# Patient Record
Sex: Female | Born: 1935 | Race: White | Hispanic: No
Health system: Southern US, Community
[De-identification: ages and names within clinical notes are randomized; demographics above are authoritative.]

## PROBLEM LIST (undated history)

## (undated) DIAGNOSIS — R42 Dizziness and giddiness: Secondary | ICD-10-CM

## (undated) DIAGNOSIS — N289 Disorder of kidney and ureter, unspecified: Secondary | ICD-10-CM

## (undated) DIAGNOSIS — K5790 Diverticulosis of intestine, part unspecified, without perforation or abscess without bleeding: Secondary | ICD-10-CM

## (undated) DIAGNOSIS — I10 Essential (primary) hypertension: Secondary | ICD-10-CM

## (undated) DIAGNOSIS — I509 Heart failure, unspecified: Secondary | ICD-10-CM

## (undated) DIAGNOSIS — D649 Anemia, unspecified: Secondary | ICD-10-CM

## (undated) DIAGNOSIS — I351 Nonrheumatic aortic (valve) insufficiency: Secondary | ICD-10-CM

## (undated) DIAGNOSIS — E785 Hyperlipidemia, unspecified: Secondary | ICD-10-CM

---

## 1998-02-17 ENCOUNTER — Other Ambulatory Visit: Admission: RE | Admit: 1998-02-17 | Discharge: 1998-02-17 | Payer: Self-pay | Admitting: Family Medicine

## 1999-05-14 ENCOUNTER — Other Ambulatory Visit: Admission: RE | Admit: 1999-05-14 | Discharge: 1999-05-14 | Payer: Self-pay | Admitting: Family Medicine

## 2000-07-13 ENCOUNTER — Ambulatory Visit (HOSPITAL_COMMUNITY): Admission: RE | Admit: 2000-07-13 | Discharge: 2000-07-13 | Payer: Self-pay | Admitting: *Deleted

## 2003-11-25 ENCOUNTER — Encounter: Admission: RE | Admit: 2003-11-25 | Discharge: 2003-11-25 | Payer: Self-pay | Admitting: Family Medicine

## 2004-05-01 ENCOUNTER — Inpatient Hospital Stay (HOSPITAL_COMMUNITY): Admission: EM | Admit: 2004-05-01 | Discharge: 2004-05-05 | Payer: Self-pay | Admitting: Emergency Medicine

## 2004-11-19 ENCOUNTER — Other Ambulatory Visit: Admission: RE | Admit: 2004-11-19 | Discharge: 2004-11-19 | Payer: Self-pay | Admitting: Family Medicine

## 2004-11-26 ENCOUNTER — Encounter (INDEPENDENT_AMBULATORY_CARE_PROVIDER_SITE_OTHER): Payer: Self-pay | Admitting: Specialist

## 2004-11-26 ENCOUNTER — Ambulatory Visit (HOSPITAL_COMMUNITY): Admission: RE | Admit: 2004-11-26 | Discharge: 2004-11-26 | Payer: Self-pay | Admitting: *Deleted

## 2008-10-07 ENCOUNTER — Encounter: Admission: RE | Admit: 2008-10-07 | Discharge: 2008-10-07 | Payer: Self-pay | Admitting: Family Medicine

## 2009-05-15 ENCOUNTER — Inpatient Hospital Stay (HOSPITAL_COMMUNITY): Admission: EM | Admit: 2009-05-15 | Discharge: 2009-05-20 | Payer: Self-pay | Admitting: Emergency Medicine

## 2009-05-16 ENCOUNTER — Ambulatory Visit: Payer: Self-pay | Admitting: Physical Medicine & Rehabilitation

## 2009-11-25 ENCOUNTER — Emergency Department (HOSPITAL_COMMUNITY): Admission: EM | Admit: 2009-11-25 | Discharge: 2009-11-25 | Payer: Self-pay | Admitting: Emergency Medicine

## 2010-05-16 ENCOUNTER — Emergency Department (HOSPITAL_COMMUNITY): Admission: EM | Admit: 2010-05-16 | Discharge: 2010-05-16 | Payer: Self-pay | Admitting: Emergency Medicine

## 2010-05-27 ENCOUNTER — Ambulatory Visit: Payer: Self-pay | Admitting: Internal Medicine

## 2010-05-28 ENCOUNTER — Encounter (INDEPENDENT_AMBULATORY_CARE_PROVIDER_SITE_OTHER): Payer: Self-pay | Admitting: Internal Medicine

## 2010-05-28 ENCOUNTER — Inpatient Hospital Stay (HOSPITAL_COMMUNITY): Admission: EM | Admit: 2010-05-28 | Discharge: 2010-05-28 | Payer: Self-pay | Admitting: Emergency Medicine

## 2010-07-26 ENCOUNTER — Inpatient Hospital Stay (HOSPITAL_COMMUNITY): Admission: EM | Admit: 2010-07-26 | Discharge: 2010-07-27 | Payer: Self-pay | Admitting: Emergency Medicine

## 2010-07-26 ENCOUNTER — Ambulatory Visit: Payer: Self-pay | Admitting: Cardiology

## 2010-12-09 LAB — CARDIAC PANEL(CRET KIN+CKTOT+MB+TROPI)
CK, MB: 0.5 ng/mL (ref 0.3–4.0)
CK, MB: 0.6 ng/mL (ref 0.3–4.0)
CK, MB: 0.8 ng/mL (ref 0.3–4.0)
Relative Index: INVALID (ref 0.0–2.5)
Total CK: 37 U/L (ref 7–177)
Total CK: 42 U/L (ref 7–177)
Troponin I: <0.01 ng/mL (ref 0.00–0.06)
Troponin I: <0.01 ng/mL (ref 0.00–0.06)

## 2010-12-09 LAB — CBC
HCT: 34.3 % — ABNORMAL LOW (ref 36.0–46.0)
Hemoglobin: 11.3 g/dL — ABNORMAL LOW (ref 12.0–15.0)
RDW: 12.7 % (ref 11.5–15.5)
WBC: 9.7 10*3/uL (ref 4.0–10.5)

## 2010-12-09 LAB — DIFFERENTIAL
Basophils Absolute: 0 10*3/uL (ref 0.0–0.1)
Basophils Relative: 0 % (ref 0–1)
Lymphocytes Relative: 18 % (ref 12–46)
Monocytes Absolute: 0.7 10*3/uL (ref 0.1–1.0)
Monocytes Relative: 8 % (ref 3–12)
Neutro Abs: 7 10*3/uL (ref 1.7–7.7)
Neutrophils Relative %: 72 % (ref 43–77)

## 2010-12-09 LAB — URINALYSIS, ROUTINE W REFLEX MICROSCOPIC
Glucose, UA: NEGATIVE mg/dL
Nitrite: NEGATIVE
Specific Gravity, Urine: 1.012 (ref 1.005–1.030)
pH: 5 (ref 5.0–8.0)

## 2010-12-09 LAB — HEPATIC FUNCTION PANEL
Alkaline Phosphatase: 68 U/L (ref 39–117)
Indirect Bilirubin: 1.2 mg/dL — ABNORMAL HIGH (ref 0.3–0.9)
Total Bilirubin: 1.3 mg/dL — ABNORMAL HIGH (ref 0.3–1.2)
Total Protein: 6.4 g/dL (ref 6.0–8.3)

## 2010-12-09 LAB — POCT I-STAT, CHEM 8
BUN: 46 mg/dL — ABNORMAL HIGH (ref 6–23)
Calcium, Ion: 1.11 mmol/L — ABNORMAL LOW (ref 1.12–1.32)
Chloride: 112 mEq/L (ref 96–112)
HCT: 36 % (ref 36.0–46.0)
Potassium: 4.1 mEq/L (ref 3.5–5.1)

## 2010-12-09 LAB — BASIC METABOLIC PANEL
BUN: 38 mg/dL — ABNORMAL HIGH (ref 6–23)
CO2: 29 mEq/L (ref 19–32)
Chloride: 109 mEq/L (ref 96–112)
Glucose, Bld: 77 mg/dL (ref 70–99)
Potassium: 4.3 mEq/L (ref 3.5–5.1)
Sodium: 143 mEq/L (ref 135–145)

## 2010-12-09 LAB — LIPASE, BLOOD: Lipase: 37 U/L (ref 11–59)

## 2010-12-09 LAB — POCT CARDIAC MARKERS
CKMB, poc: <1 ng/mL — ABNORMAL LOW (ref 1.0–8.0)
Troponin i, poc: <0.05 ng/mL (ref 0.00–0.09)

## 2010-12-10 LAB — COMPREHENSIVE METABOLIC PANEL
ALT: 16 U/L (ref 0–35)
Alkaline Phosphatase: 75 U/L (ref 39–117)
CO2: 30 mEq/L (ref 19–32)
Chloride: 111 mEq/L (ref 96–112)
GFR calc non Af Amer: 23 mL/min — ABNORMAL LOW (ref >60)
Glucose, Bld: 104 mg/dL — ABNORMAL HIGH (ref 70–99)
Potassium: 4.6 mEq/L (ref 3.5–5.1)
Sodium: 148 mEq/L — ABNORMAL HIGH (ref 135–145)
Total Bilirubin: 0.8 mg/dL (ref 0.3–1.2)

## 2010-12-10 LAB — CARDIAC PANEL(CRET KIN+CKTOT+MB+TROPI)
CK, MB: 0.8 ng/mL (ref 0.3–4.0)
Relative Index: INVALID (ref 0.0–2.5)
Total CK: 48 U/L (ref 7–177)
Troponin I: 0.02 ng/mL (ref 0.00–0.06)

## 2010-12-10 LAB — MAGNESIUM: Magnesium: 2.5 mg/dL (ref 1.5–2.5)

## 2010-12-10 LAB — LIPID PANEL: VLDL: 22 mg/dL (ref 0–40)

## 2010-12-11 LAB — URINE CULTURE
Colony Count: >=100000
Culture  Setup Time: 201108202110

## 2010-12-11 LAB — CBC
HCT: 32.8 % — ABNORMAL LOW (ref 36.0–46.0)
HCT: 32.9 % — ABNORMAL LOW (ref 36.0–46.0)
Hemoglobin: 10.8 g/dL — ABNORMAL LOW (ref 12.0–15.0)
Hemoglobin: 10.9 g/dL — ABNORMAL LOW (ref 12.0–15.0)
MCH: 30.2 pg (ref 26.0–34.0)
MCH: 30.9 pg (ref 26.0–34.0)
MCHC: 32.8 g/dL (ref 30.0–36.0)
MCHC: 33.2 g/dL (ref 30.0–36.0)
MCV: 91.9 fL (ref 78.0–100.0)
MCV: 92.9 fL (ref 78.0–100.0)
Platelets: 172 K/uL (ref 150–400)
Platelets: 189 K/uL (ref 150–400)
RBC: 3.53 MIL/uL — ABNORMAL LOW (ref 3.87–5.11)
RBC: 3.58 MIL/uL — ABNORMAL LOW (ref 3.87–5.11)
RDW: 12.5 % (ref 11.5–15.5)
RDW: 12.8 % (ref 11.5–15.5)
WBC: 6.5 K/uL (ref 4.0–10.5)
WBC: 7.7 K/uL (ref 4.0–10.5)

## 2010-12-11 LAB — POCT I-STAT, CHEM 8
BUN: 53 mg/dL — ABNORMAL HIGH (ref 6–23)
Calcium, Ion: 1.19 mmol/L (ref 1.12–1.32)
Chloride: 109 meq/L (ref 96–112)
Creatinine, Ser: 2 mg/dL — ABNORMAL HIGH (ref 0.4–1.2)
Glucose, Bld: 94 mg/dL (ref 70–99)
HCT: 33 % — ABNORMAL LOW (ref 36.0–46.0)
Hemoglobin: 11.2 g/dL — ABNORMAL LOW (ref 12.0–15.0)
Potassium: 4.8 meq/L (ref 3.5–5.1)
Sodium: 141 meq/L (ref 135–145)
TCO2: 28 mmol/L (ref 0–100)

## 2010-12-11 LAB — POCT CARDIAC MARKERS: Troponin i, poc: <0.05 ng/mL (ref 0.00–0.09)

## 2010-12-11 LAB — BASIC METABOLIC PANEL
BUN: 44 mg/dL — ABNORMAL HIGH (ref 6–23)
CO2: 28 mEq/L (ref 19–32)
Calcium: 8.9 mg/dL (ref 8.4–10.5)
Chloride: 112 mEq/L (ref 96–112)
Creatinine, Ser: 2.22 mg/dL — ABNORMAL HIGH (ref 0.4–1.2)
Glucose, Bld: 131 mg/dL — ABNORMAL HIGH (ref 70–99)

## 2010-12-11 LAB — URINALYSIS, ROUTINE W REFLEX MICROSCOPIC
Bilirubin Urine: NEGATIVE
Glucose, UA: NEGATIVE mg/dL
Hgb urine dipstick: NEGATIVE
Ketones, ur: NEGATIVE mg/dL
Nitrite: POSITIVE — AB
Protein, ur: NEGATIVE mg/dL
Specific Gravity, Urine: 1.011 (ref 1.005–1.030)
Urobilinogen, UA: 0.2 mg/dL (ref 0.0–1.0)
pH: 5 (ref 5.0–8.0)

## 2010-12-11 LAB — DIFFERENTIAL
Basophils Absolute: 0 K/uL (ref 0.0–0.1)
Basophils Relative: 0 % (ref 0–1)
Basophils Relative: 1 % (ref 0–1)
Eosinophils Absolute: 0.2 K/uL (ref 0.0–0.7)
Eosinophils Absolute: 0.3 10*3/uL (ref 0.0–0.7)
Eosinophils Relative: 4 % (ref 0–5)
Eosinophils Relative: 4 % (ref 0–5)
Lymphocytes Relative: 28 % (ref 12–46)
Lymphs Abs: 1.8 K/uL (ref 0.7–4.0)
Lymphs Abs: 1.9 10*3/uL (ref 0.7–4.0)
Monocytes Absolute: 0.7 K/uL (ref 0.1–1.0)
Monocytes Relative: 11 % (ref 3–12)
Neutro Abs: 3.7 K/uL (ref 1.7–7.7)
Neutrophils Relative %: 57 % (ref 43–77)
Neutrophils Relative %: 61 % (ref 43–77)

## 2010-12-11 LAB — URINE MICROSCOPIC-ADD ON

## 2010-12-11 LAB — BRAIN NATRIURETIC PEPTIDE: Pro B Natriuretic peptide (BNP): 307 pg/mL — ABNORMAL HIGH (ref 0.0–100.0)

## 2010-12-21 LAB — COMPREHENSIVE METABOLIC PANEL
ALT: 21 U/L (ref 0–35)
Alkaline Phosphatase: 85 U/L (ref 39–117)
BUN: 48 mg/dL — ABNORMAL HIGH (ref 6–23)
Chloride: 102 mEq/L (ref 96–112)
Glucose, Bld: 100 mg/dL — ABNORMAL HIGH (ref 70–99)
Potassium: 4.3 mEq/L (ref 3.5–5.1)
Sodium: 138 mEq/L (ref 135–145)
Total Bilirubin: 1 mg/dL (ref 0.3–1.2)
Total Protein: 6.9 g/dL (ref 6.0–8.3)

## 2010-12-21 LAB — URINE MICROSCOPIC-ADD ON

## 2010-12-21 LAB — URINALYSIS, ROUTINE W REFLEX MICROSCOPIC
Glucose, UA: NEGATIVE mg/dL
Ketones, ur: NEGATIVE mg/dL
Nitrite: NEGATIVE
Specific Gravity, Urine: 1.016 (ref 1.005–1.030)
pH: 6.5 (ref 5.0–8.0)

## 2010-12-21 LAB — POCT CARDIAC MARKERS
CKMB, poc: 2.1 ng/mL (ref 1.0–8.0)
CKMB, poc: 2.9 ng/mL (ref 1.0–8.0)
Myoglobin, poc: >500 ng/mL (ref 12–200)
Myoglobin, poc: >500 ng/mL (ref 12–200)

## 2010-12-21 LAB — DIFFERENTIAL
Basophils Absolute: 0 10*3/uL (ref 0.0–0.1)
Basophils Relative: 0 % (ref 0–1)
Eosinophils Absolute: 0.2 10*3/uL (ref 0.0–0.7)
Monocytes Absolute: 0.5 10*3/uL (ref 0.1–1.0)
Monocytes Relative: 6 % (ref 3–12)
Neutro Abs: 6.7 10*3/uL (ref 1.7–7.7)
Neutrophils Relative %: 76 % (ref 43–77)

## 2010-12-21 LAB — CBC
HCT: 33.6 % — ABNORMAL LOW (ref 36.0–46.0)
Hemoglobin: 11.4 g/dL — ABNORMAL LOW (ref 12.0–15.0)
MCV: 93.3 fL (ref 78.0–100.0)
RBC: 3.6 MIL/uL — ABNORMAL LOW (ref 3.87–5.11)
WBC: 8.9 10*3/uL (ref 4.0–10.5)

## 2010-12-21 LAB — URINE CULTURE

## 2011-01-02 LAB — URINE CULTURE

## 2011-01-02 LAB — BASIC METABOLIC PANEL
BUN: 33 mg/dL — ABNORMAL HIGH (ref 6–23)
CO2: 26 mEq/L (ref 19–32)
Calcium: 8.9 mg/dL (ref 8.4–10.5)
Chloride: 107 mEq/L (ref 96–112)
GFR calc Af Amer: 41 mL/min — ABNORMAL LOW (ref >60)
GFR calc non Af Amer: 34 mL/min — ABNORMAL LOW (ref >60)
Potassium: 4.4 mEq/L (ref 3.5–5.1)
Potassium: 4.8 mEq/L (ref 3.5–5.1)
Sodium: 140 mEq/L (ref 135–145)

## 2011-01-02 LAB — PROTIME-INR
INR: 0.9 (ref 0.00–1.49)
INR: 1.1 (ref 0.00–1.49)
INR: 1.4 (ref 0.00–1.49)
INR: 1.8 — ABNORMAL HIGH (ref 0.00–1.49)
Prothrombin Time: 12.4 s (ref 11.6–15.2)
Prothrombin Time: 16.8 seconds — ABNORMAL HIGH (ref 11.6–15.2)
Prothrombin Time: 25.5 seconds — ABNORMAL HIGH (ref 11.6–15.2)

## 2011-01-02 LAB — CBC
HCT: 22.6 % — ABNORMAL LOW (ref 36.0–46.0)
HCT: 32.2 % — ABNORMAL LOW (ref 36.0–46.0)
Hemoglobin: 11.1 g/dL — ABNORMAL LOW (ref 12.0–15.0)
MCHC: 35.1 g/dL (ref 30.0–36.0)
MCV: 95.5 fL (ref 78.0–100.0)
Platelets: 163 10*3/uL (ref 150–400)
Platelets: 194 10*3/uL (ref 150–400)
RBC: 2.37 MIL/uL — ABNORMAL LOW (ref 3.87–5.11)
RBC: 3.36 MIL/uL — ABNORMAL LOW (ref 3.87–5.11)
RDW: 13.1 % (ref 11.5–15.5)
WBC: 9.8 10*3/uL (ref 4.0–10.5)

## 2011-01-02 LAB — URINALYSIS, ROUTINE W REFLEX MICROSCOPIC
Glucose, UA: NEGATIVE mg/dL
Hgb urine dipstick: NEGATIVE
Protein, ur: NEGATIVE mg/dL
Specific Gravity, Urine: 1.018 (ref 1.005–1.030)
pH: 6 (ref 5.0–8.0)

## 2011-01-02 LAB — DIFFERENTIAL
Basophils Relative: 1 % (ref 0–1)
Eosinophils Relative: 0 % (ref 0–5)
Lymphocytes Relative: 8 % — ABNORMAL LOW (ref 12–46)
Lymphs Abs: 0.9 10*3/uL (ref 0.7–4.0)
Monocytes Relative: 7 % (ref 3–12)
Neutro Abs: 9.2 10*3/uL — ABNORMAL HIGH (ref 1.7–7.7)

## 2011-01-02 LAB — HEMOGLOBIN AND HEMATOCRIT, BLOOD: Hemoglobin: 8.4 g/dL — ABNORMAL LOW (ref 12.0–15.0)

## 2011-01-02 LAB — URINE MICROSCOPIC-ADD ON

## 2011-02-05 ENCOUNTER — Emergency Department (HOSPITAL_COMMUNITY)
Admission: EM | Admit: 2011-02-05 | Discharge: 2011-02-05 | Disposition: A | Discharge status: Home / Self Care | Payer: Medicare Other | Attending: Emergency Medicine | Admitting: Emergency Medicine

## 2011-02-05 ENCOUNTER — Emergency Department (HOSPITAL_COMMUNITY): Payer: Medicare Other

## 2011-02-05 ENCOUNTER — Encounter (HOSPITAL_COMMUNITY): Payer: Self-pay | Admitting: Radiology

## 2011-02-05 DIAGNOSIS — M545 Low back pain, unspecified: Secondary | ICD-10-CM | POA: Insufficient documentation

## 2011-02-05 DIAGNOSIS — Y92009 Unspecified place in unspecified non-institutional (private) residence as the place of occurrence of the external cause: Secondary | ICD-10-CM | POA: Insufficient documentation

## 2011-02-05 DIAGNOSIS — IMO0002 Reserved for concepts with insufficient information to code with codable children: Secondary | ICD-10-CM | POA: Insufficient documentation

## 2011-02-05 DIAGNOSIS — Z79899 Other long term (current) drug therapy: Secondary | ICD-10-CM | POA: Insufficient documentation

## 2011-02-05 DIAGNOSIS — S0003XA Contusion of scalp, initial encounter: Secondary | ICD-10-CM | POA: Insufficient documentation

## 2011-02-05 DIAGNOSIS — M25559 Pain in unspecified hip: Secondary | ICD-10-CM | POA: Insufficient documentation

## 2011-02-05 DIAGNOSIS — S0990XA Unspecified injury of head, initial encounter: Secondary | ICD-10-CM | POA: Insufficient documentation

## 2011-02-05 DIAGNOSIS — M542 Cervicalgia: Secondary | ICD-10-CM | POA: Insufficient documentation

## 2011-02-05 DIAGNOSIS — W010XXA Fall on same level from slipping, tripping and stumbling without subsequent striking against object, initial encounter: Secondary | ICD-10-CM | POA: Insufficient documentation

## 2011-02-05 DIAGNOSIS — I10 Essential (primary) hypertension: Secondary | ICD-10-CM | POA: Insufficient documentation

## 2011-02-05 DIAGNOSIS — I509 Heart failure, unspecified: Secondary | ICD-10-CM | POA: Insufficient documentation

## 2011-02-05 DIAGNOSIS — R51 Headache: Secondary | ICD-10-CM | POA: Insufficient documentation

## 2011-02-05 HISTORY — DX: Heart failure, unspecified: I50.9

## 2011-02-05 HISTORY — DX: Essential (primary) hypertension: I10

## 2011-02-09 NOTE — Op Note (Signed)
[name], [name]                 ACCOUNT NO.:  [id number]   MEDICAL RECORD NO.:  [id number]          PATIENT TYPE:  INP   LOCATION:  5021                         FACILITY:  [facility]   PHYSICIAN:  Nadara Mustard, MD     DATE OF BIRTH:  06-04-1921   DATE OF PROCEDURE:  05/15/2009  DATE OF DISCHARGE:                               OPERATIVE REPORT   PREOPERATIVE DIAGNOSIS:  Left intertrochanteric hip fracture.   POSTOPERATIVE DIAGNOSIS:  Left intertrochanteric hip fracture.   PROCEDURE:  IM nail, left hip with a Synthes 11 x 170 mm nail 90-mm  spiral blade, 130 degree ankle with a 34-mm distal locking screws.   SURGEON:  Nadara Mustard, MD   ANESTHESIA:  Spinal.   ESTIMATED BLOOD LOSS:  Minimal.   ANTIBIOTICS:  Clindamycin 600 mg IV.   DRAINS:  None.   COMPLICATIONS:  None.   DISPOSITION:  To PACU in stable condition.   INDICATION FOR PROCEDURE:  The patient is an 77 year old woman who went  out to check her pre-cement fees yesterday evening.  The patient slipped  and fell and sustained a left intertrochanteric hip fracture.  She spent  the night outside in the rain and was found the next morning by friends.  The patient was brought to the hospital, was evaluated, and felt to be  safe for surgical intervention.  We consulted her cardiologist and he  states that she did have multiple medical problems, but stated that it  was okay to pursue with fixing the hip fracture.  Risks and benefits  were discussed with the patient and her family including infection,  neurovascular injury, persistent pain, need for additional surgery.  The  patient and family state they understand and wished to proceed at this  time.   DESCRIPTION OF PROCEDURE:  The patient was brought to OR room four and  underwent a spinal anesthetic.  After adequate level of anesthesia  obtained, the patient was placed on the [place] fracture table.  The  left foot was placed in traction and the right leg was placed  in the  dorsal lithotomy position.  The fracture was reduced and the left hip  was prepped using DuraPrep, draped in the sterile field with a shower  curtain.  A lateral incision was made proximal to the greater  trochanter.  A guidewire was inserted through the greater trochanter  down the femoral shaft.  C-arm fluoroscopy verified alignment at both AP  and lateral planes.  This was then overdrilled with the reamer.  The  nail was inserted, guide was used to place the guide pin just inferior  and posterior to the center-center position.  This measured 90 mm.  The  lateral cortex was reamed and the 90-mm spiral blade was inserted.  This  was locked proximally with the locking screw distally, it was locked  with a 34-mm distal locking screw.  C-arm fluoroscopy  verified reduction at both AP and lateral planes.  The wounds were  irrigated with normal saline.  Subcu was closed using 2-0 Vicryl.  Skin  was closed  using approximating staples.  The wounds were covered with  Mepilex dressing.  The patient was then taken to PACU in stable  condition.      Nadara Mustard, MD  Electronically Signed     MVD/MEDQ  D:  05/15/2009  T:  05/16/2009  Job:  [id number]

## 2011-02-09 NOTE — Discharge Summary (Signed)
Sandra [name], Sandra [name]                 ACCOUNT NO.:  [id number]   MEDICAL RECORD NO.:  [id number]          PATIENT TYPE:  INP   LOCATION:  5021                         FACILITY:  [facility]   PHYSICIAN:  Sandra Mustard, MD     DATE OF BIRTH:  1921/04/20   DATE OF ADMISSION:  05/15/2009  DATE OF DISCHARGE:  05/20/2009                               DISCHARGE SUMMARY   FINAL DIAGNOSIS:  Left intertrochanteric hip fracture.   PROCEDURE:  Intramedullary nail, left hip fracture.   Discharge to skilled nursing in stable condition.  Physical therapy,  progressive ambulation, weightbearing as tolerated on the left.  Follow  up with Dr. Lajoyce Corners in 3 weeks.   DISCHARGE MEDICATIONS:  1. Lasix 40 mg p.o. daily.  2. Tenormin 25 mg p.o. daily.  3. Coumadin 1 mg p.o. daily for 4 weeks.  4. Colace 100 mg p.o. b.i.d.  5. MiraLax 17 g powder p.o. daily.  6. Trinsicon and Foltrin capsule 1 p.o. b.i.d.  7. Nitroglycerin tablets 0.4 mg sublingual q.5 minutes p.r.n. chest      pain.  8. Vicodin 1 p.o. q.4. h p.r.n. for pain.  9. Meclizine 25 mg p.o. q.8 h. p.r.n. vertigo.  10.Tylenol 325 mg p.o. q.4 h. p.r.n. for pain.  11.Ambien 5 mg p.o. nightly p.r.n. sleep.   HISTORY OF PRESENT ILLNESS:  The patient is an 77 year old woman who  when outside fell in her yard and spent the night outside after  sustaining a left intertrochanteric hip fracture.  She was found the  next day, brought to the emergency room, was evaluated and felt to be  safe for surgical intervention.  The patient underwent IM nailing of the  left intertrochanteric hip fracture on May 15, 2009, with a Synthes  nail 11 x 170 mm, 90-mm spiral blade, 34-mm locking screw.  She received  clindamycin for infection prophylaxis and Coumadin for DVT prophylaxis.  Postoperatively, the patient's hemoglobin did drop and she was  supplemented with iron.  Hemoglobin dropped to a level of 7.9.  The  patient did live alone at home and it was felt that she  would require  skilled nursing short-term until she was safe to return home.  The  patient was discharged to short-term skilled nursing in stable condition  on May 20, 2009, with followup with Dr. Lajoyce Corners in the office in 3  weeks.      Sandra Mustard, MD  Electronically Signed     MVD/MEDQ  D:  05/20/2009  T:  05/20/2009  Job:  [id number]

## 2011-02-12 NOTE — Discharge Summary (Signed)
NAME:  JON, KASPAREK                    ACCOUNT NO.:  [id number]   MEDICAL RECORD NO.:  [id number]                   PATIENT TYPE:  INP   LOCATION:  0476                                 FACILITY:  [facility]   PHYSICIAN:  Lubertha Basque. Jerl Santos, M.D.             DATE OF BIRTH:  April 21, 1921   DATE OF ADMISSION:  05/01/2004  DATE OF DISCHARGE:  05/05/2004                                 DISCHARGE SUMMARY   ADMITTING DIAGNOSES:  1. Stable pubic rami fracture.  2. Hypertension.   DISCHARGE DIAGNOSES:  1. Stable pubic rami fracture.  2. Hypertension.   OPERATIONS:  None.   BRIEF HISTORY:  This is an 77 year old white female who fell late in the  evening on April 30, 2004/early in the morning on May 01, 2004 had  difficulty ambulating and was transported to [facility] at  which time x-rays were obtained which showed a nondisplaced inferior pubic  rami fracture and due to her inability to ambulate and the fact that she  lives alone she was admitted to the hospital for pain control, physical  therapy and then placement.   PERTINENT LABORATORY AND X-RAY FINDINGS:  Patient had a CBC drawn; her white  count was 13.8, hemoglobin was 11.5, hematocrit 33.6.  Four-view x-rays of  her knee were done that showed mild degenerative change, right hip showed  inferior pubic rami fracture, femur showed no abnormality.   COURSE IN THE HOSPITAL:  Patient was admitted from the emergency room and  put on the floor with appropriate analgesia.  She was given Vicodin one or  two q.4-6h.  Physical therapy was ordered.  She could be weightbearing as  tolerated with assistance from a walker.  She was also kept on her blood  pressure medicine which was Tenormin 25 mg one a day, a regular diet and  social services was consulted for help with placement.  During her hospital  stay her blood pressure remained approximately 151/60, heart rate of 83,  temperature was afebrile, she had normal  neurovascular status to her lower  extremities, some pelvic pain but bowels and bladder were working no problem  and she was eating well.  We have contacted her family, in particular her  daughter, about placement and I agreed with her that she was not able to be  discharged to home as she lives alone so the family was selecting a nursing  facility/skilled nursing facility or [facility] but at the time of this dictation  SACU beds were not available.   CONDITION ON DISCHARGE:  Improved.   FOLLOW UP:  She will remain on a regular diet.  She can be weightbearing as  tolerated, physical therapy for assistance with gait.  She will remain on  her home medications:  Tenormin 25 mg one a day and Tylenol No. 3 one  or two q.4-6h. p.r.n. pain.  She will return to our office after 3-4 weeks  from discharge  date and should call [phone] for appointment time.  If any  problems arise she can also call that number and I spoken these details with  her daughter as well at the time of her discharge.     Lindwood Qua, P.A.                    Lubertha Basque Jerl Santos, M.D.    MC/MEDQ  D:  05/05/2004  T:  05/05/2004  Job:  [id number]

## 2011-05-03 ENCOUNTER — Emergency Department (HOSPITAL_COMMUNITY): Payer: Medicare Other

## 2011-05-03 ENCOUNTER — Inpatient Hospital Stay (HOSPITAL_COMMUNITY)
Admission: EM | Admit: 2011-05-03 | Discharge: 2011-05-06 | DRG: 481 | Disposition: A | Discharge status: Skilled Nursing Facility | Payer: Medicare Other | Attending: Internal Medicine | Admitting: Internal Medicine

## 2011-05-03 DIAGNOSIS — I472 Ventricular tachycardia, unspecified: Secondary | ICD-10-CM | POA: Diagnosis not present

## 2011-05-03 DIAGNOSIS — B961 Klebsiella pneumoniae [K. pneumoniae] as the cause of diseases classified elsewhere: Secondary | ICD-10-CM | POA: Diagnosis present

## 2011-05-03 DIAGNOSIS — S7223XA Displaced subtrochanteric fracture of unspecified femur, initial encounter for closed fracture: Principal | ICD-10-CM | POA: Diagnosis present

## 2011-05-03 DIAGNOSIS — I4729 Other ventricular tachycardia: Secondary | ICD-10-CM | POA: Diagnosis not present

## 2011-05-03 DIAGNOSIS — I129 Hypertensive chronic kidney disease with stage 1 through stage 4 chronic kidney disease, or unspecified chronic kidney disease: Secondary | ICD-10-CM | POA: Diagnosis present

## 2011-05-03 DIAGNOSIS — I509 Heart failure, unspecified: Secondary | ICD-10-CM | POA: Diagnosis present

## 2011-05-03 DIAGNOSIS — R296 Repeated falls: Secondary | ICD-10-CM | POA: Diagnosis present

## 2011-05-03 DIAGNOSIS — N183 Chronic kidney disease, stage 3 unspecified: Secondary | ICD-10-CM | POA: Diagnosis present

## 2011-05-03 DIAGNOSIS — F0390 Unspecified dementia without behavioral disturbance: Secondary | ICD-10-CM | POA: Diagnosis present

## 2011-05-03 DIAGNOSIS — N390 Urinary tract infection, site not specified: Secondary | ICD-10-CM | POA: Diagnosis present

## 2011-05-03 DIAGNOSIS — H9190 Unspecified hearing loss, unspecified ear: Secondary | ICD-10-CM | POA: Diagnosis present

## 2011-05-03 DIAGNOSIS — R42 Dizziness and giddiness: Secondary | ICD-10-CM | POA: Diagnosis present

## 2011-05-03 DIAGNOSIS — I5030 Unspecified diastolic (congestive) heart failure: Secondary | ICD-10-CM | POA: Diagnosis present

## 2011-05-03 DIAGNOSIS — D62 Acute posthemorrhagic anemia: Secondary | ICD-10-CM | POA: Diagnosis not present

## 2011-05-03 LAB — URINE MICROSCOPIC-ADD ON

## 2011-05-03 LAB — DIFFERENTIAL
Basophils Relative: 0 % (ref 0–1)
Eosinophils Absolute: 0.2 10*3/uL (ref 0.0–0.7)
Monocytes Absolute: 0.5 10*3/uL (ref 0.1–1.0)
Monocytes Relative: 7 % (ref 3–12)

## 2011-05-03 LAB — URINALYSIS, ROUTINE W REFLEX MICROSCOPIC
Hgb urine dipstick: NEGATIVE
Ketones, ur: 15 mg/dL — AB
Nitrite: POSITIVE — AB
pH: 5 (ref 5.0–8.0)

## 2011-05-03 LAB — CBC
Hemoglobin: 9.5 g/dL — ABNORMAL LOW (ref 12.0–15.0)
MCH: 30.4 pg (ref 26.0–34.0)
MCHC: 34.2 g/dL (ref 30.0–36.0)
MCV: 88.8 fL (ref 78.0–100.0)
Platelets: 167 10*3/uL (ref 150–400)

## 2011-05-03 LAB — APTT: aPTT: 26 seconds (ref 24–37)

## 2011-05-03 LAB — PROTIME-INR: Prothrombin Time: 13.8 seconds (ref 11.6–15.2)

## 2011-05-03 LAB — CK TOTAL AND CKMB (NOT AT ARMC): Total CK: 110 U/L (ref 7–177)

## 2011-05-03 LAB — BASIC METABOLIC PANEL
BUN: 34 mg/dL — ABNORMAL HIGH (ref 6–23)
Chloride: 106 mEq/L (ref 96–112)
Creatinine, Ser: 1.56 mg/dL — ABNORMAL HIGH (ref 0.50–1.10)
GFR calc Af Amer: 38 mL/min — ABNORMAL LOW (ref >60)
Glucose, Bld: 132 mg/dL — ABNORMAL HIGH (ref 70–99)

## 2011-05-04 ENCOUNTER — Inpatient Hospital Stay (HOSPITAL_COMMUNITY): Payer: Medicare Other

## 2011-05-04 LAB — VITAMIN B12: Vitamin B-12: 370 pg/mL (ref 211–911)

## 2011-05-04 LAB — CBC
HCT: 23.5 % — ABNORMAL LOW (ref 36.0–46.0)
MCV: 90 fL (ref 78.0–100.0)
RBC: 2.61 MIL/uL — ABNORMAL LOW (ref 3.87–5.11)
RDW: 12.9 % (ref 11.5–15.5)
WBC: 6.4 10*3/uL (ref 4.0–10.5)

## 2011-05-04 LAB — URINE CULTURE: Culture  Setup Time: 201208061816

## 2011-05-04 LAB — CARDIAC PANEL(CRET KIN+CKTOT+MB+TROPI)
CK, MB: 2.5 ng/mL (ref 0.3–4.0)
CK, MB: 2.2 ng/mL (ref 0.3–4.0)
Relative Index: 1.9 (ref 0.0–2.5)
Relative Index: 1.7 (ref 0.0–2.5)
Total CK: 130 U/L (ref 7–177)
Total CK: 129 U/L (ref 7–177)
Troponin I: <0.3 ng/mL
Troponin I: <0.3 ng/mL

## 2011-05-04 LAB — FOLATE: Folate: >20 ng/mL

## 2011-05-04 LAB — PREPARE RBC (CROSSMATCH)

## 2011-05-04 LAB — BASIC METABOLIC PANEL WITH GFR
BUN: 30 mg/dL — ABNORMAL HIGH (ref 6–23)
CO2: 28 meq/L (ref 19–32)
Calcium: 8.6 mg/dL (ref 8.4–10.5)
Chloride: 109 meq/L (ref 96–112)
Creatinine, Ser: 1.34 mg/dL — ABNORMAL HIGH (ref 0.50–1.10)
GFR calc Af Amer: 45 mL/min — ABNORMAL LOW
GFR calc non Af Amer: 37 mL/min — ABNORMAL LOW
Glucose, Bld: 102 mg/dL — ABNORMAL HIGH (ref 70–99)
Potassium: 4.3 meq/L (ref 3.5–5.1)
Sodium: 144 meq/L (ref 135–145)

## 2011-05-04 LAB — IRON AND TIBC
Saturation Ratios: 15 % — ABNORMAL LOW (ref 20–55)
TIBC: 297 ug/dL (ref 250–470)
UIBC: 252 ug/dL

## 2011-05-04 LAB — HEMOGLOBIN AND HEMATOCRIT, BLOOD
HCT: 23.1 % — ABNORMAL LOW (ref 36.0–46.0)
Hemoglobin: 7.7 g/dL — ABNORMAL LOW (ref 12.0–15.0)

## 2011-05-04 LAB — SURGICAL PCR SCREEN
MRSA, PCR: NEGATIVE
Staphylococcus aureus: NEGATIVE

## 2011-05-05 LAB — CBC
HCT: 27 % — ABNORMAL LOW (ref 36.0–46.0)
Hemoglobin: 9.1 g/dL — ABNORMAL LOW (ref 12.0–15.0)
MCV: 87.4 fL (ref 78.0–100.0)
RBC: 3.09 MIL/uL — ABNORMAL LOW (ref 3.87–5.11)
RDW: 14.6 % (ref 11.5–15.5)
WBC: 8.4 10*3/uL (ref 4.0–10.5)

## 2011-05-05 LAB — BASIC METABOLIC PANEL
CO2: 28 mEq/L (ref 19–32)
Chloride: 107 mEq/L (ref 96–112)
Glucose, Bld: 108 mg/dL — ABNORMAL HIGH (ref 70–99)
Potassium: 4.6 mEq/L (ref 3.5–5.1)
Sodium: 141 mEq/L (ref 135–145)

## 2011-05-05 LAB — PROTIME-INR: INR: 1.06 (ref 0.00–1.49)

## 2011-05-06 LAB — BASIC METABOLIC PANEL
BUN: 20 mg/dL (ref 6–23)
Chloride: 104 mEq/L (ref 96–112)
GFR calc non Af Amer: 34 mL/min — ABNORMAL LOW (ref >60)
Glucose, Bld: 127 mg/dL — ABNORMAL HIGH (ref 70–99)
Potassium: 4.1 mEq/L (ref 3.5–5.1)
Sodium: 137 mEq/L (ref 135–145)

## 2011-05-06 LAB — CBC
HCT: 22.1 % — ABNORMAL LOW (ref 36.0–46.0)
Hemoglobin: 7.5 g/dL — ABNORMAL LOW (ref 12.0–15.0)
MCH: 29.2 pg (ref 26.0–34.0)
MCV: 86 fL (ref 78.0–100.0)
Platelets: 189 10*3/uL (ref 150–400)
RBC: 2.57 MIL/uL — ABNORMAL LOW (ref 3.87–5.11)
WBC: 6.3 10*3/uL (ref 4.0–10.5)

## 2011-05-07 LAB — TYPE AND SCREEN
ABO/RH(D): O POS
Antibody Screen: NEGATIVE
Unit division: 0

## 2011-05-07 NOTE — Discharge Summary (Signed)
NAME:  Sandra [name], Sandra [name] NO.:  [id number]  MEDICAL RECORD NO.:  [id number]  LOCATION:  4714                         FACILITY:  [facility]  PHYSICIAN:  Andreas Blower, MD       DATE OF BIRTH:  1920/10/31  DATE OF ADMISSION:  05/03/2011 DATE OF DISCHARGE:                        DISCHARGE SUMMARY - REFERRING   PRIMARY CARE PHYSICIAN:  Burnell Blanks, MD  CARDIOLOGIST:  Lyn Records, MD  DISCHARGE DIAGNOSES: 1. Right femur subtrochanteric fracture status post intramedullary     nail to right hip on May 04, 2011. 2. Hypertension. 3. Klebsiella pneumonia urinary tract infection resistant to     ampicillin and nitrofurantoin, otherwise sensitive to all other     antibiotics. 4. Anemia due to acute blood loss from hip fracture and surgery status     post three units of PRBC total. 5. Chronic kidney disease stage III. 6. Dementia. 7. Six-beat run of V-tach, not symptomatic. 8. History of hearing loss. 9. History of vertigo. 10.History of pelvic fracture in August 2010. 11.History of total abdominal hysterectomy. 12.History of valvular heart disease with moderate aortic     regurgitation with moderate-to-severe mitral regurgitation based on     the 2-D echocardiogram of 2012. 13.History of diastolic heart failure with EF of 55% based on 2-D     echocardiogram in September 2011.  DISCHARGE MEDICATIONS: 1. Acetaminophen 650 mg every 6 hours as needed for pain or     discomfort. 2. Ciprofloxacin 250 mg p.o. twice daily to be continued until May 08, 2011. 3. Warfarin 1 mg p.o. q.p.m. to be continued for total of 30 days from     May 27, 2011. 4. Furosemide 10 mg every other day. 5. Hydrocodone/APAP 5/325 mg 1-2 tablets every six hours as needed for     pain. 6. Amlodipine 5 mg p.o. q.a.m. 7. Caltrate 600 mg/vitamin D3 one tablet p.o. q.a.m. 8. Gabapentin 100 mg p.o. twice daily. 9. ICAPS over-the-counter 1 tablet p.o. q.a.m. 10.Lexapro 10 mg p.o.  q.a.m. 11.Meclizine 25 mg in the morning and 12.5 mg at night. 12.Omeprazole DR 40 mg p.o. q.a.m. 13.Vitamin B12, 500 mg p.o. q.a.m.  BRIEF ADMITTING HISTORY AND PHYSICAL:  Ms. Veno is an 77 year old Caucasian female, who presented to the ER after having a fall and having pain in her right hip.  RADIOLOGY/IMAGING:  The patient had multiple x-rays of the hip, most recent one on May 04, 2011 showed internal fixation of the right intertrochanteric femur fracture in anatomic alignment.  LABORATORY DATA:  CBC shows a white count of 6.3, hemoglobin 7.5, hematocrit 22.1, platelet count 189.  Electrolytes normal with a BUN of 20, creatinine 1.47.  Troponins negative x3.  BNP is 1067.  Serum iron was 45.  TIBC 297.  UA was positive for nitrates and moderate leukocytes.  Urine culture grew Klebsiella pneumonia that was resistant to ampicillin and nitrofurantoin, sensitive to ciprofloxacin and ceftriaxone.  HOSPITAL COURSE: 1. Right hip fracture.  The patient was evaluated by Dr. Lajoyce Corners and had     surgery on May 04, 2011.  The patient was started on Coumadin for     prophylaxis.  Dr. Lajoyce Corners recommended that the patient could be     discharged home on 1 mg of Coumadin p.o. q.p.m. for 30 days. 2. Anemia.  The patient had anemia likely due to acute bleed from hip     fracture and surgery.  The patient received two units of PRBC,     after surgery. The patient also received one more unit of     PRBC on May 06, 2011 prior to discharge.  Anemia panel was     checked and the patient had appropriate iron stores. 3. Hypertension, stable.  Continue the patient on home medications. 4. Klebsiella pneumonia urinary tract infection.  The patient     initially was on IV ciprofloxacin, which was transitioned to p.o.     The patient will continue ciprofloxacin until May 08, 2011 to     complete at least 5-day course of antibiotics. 5. Chronic kidney disease stage III, creatinine stable.  The  patient's     furosemide dose was changed from 10 mg p.o. daily to every other     day in about 3-4 days depending on how the patient does at skilled     nursing facility, could transition the patient back to furosemide     10 mg p.o. daily. 6. Dementia, stable. 7. Six-beat run of V-tach, on May 06, 2011, at 2:15 a.m.  Discussed     the patient with Dr. Mayford Knife who stated that given the patient has     had a 2-D echocardiogram on September 2011, which showed the     patient had mild LVH with systolic function, but was normal with     ejection fraction of 50-55% with mild aortic stenosis with mild-to-     moderate aortic regurgitation with calcified mitral annulus.  Dr.     Mayford Knife recommended the patient could follow with Dr. Katrinka Blazing as an     outpatient.  DISPOSITION AND FOLLOWUP:  The patient follow up with primary care physician in 1-2 weeks.  The patient to follow with Dr. Verdis Prime on May 13, 2011 at 3 p.m.  The patient is also scheduled to have 2-D echocardiogram at the appointment visit with Dr. Katrinka Blazing.  The patient to follow up with Dr. Lajoyce Corners in 3 weeks.  Time spent on discharge talking to the patient and coordinating care was 40 minutes.     Andreas Blower, MD     SR/MEDQ  D:  05/06/2011  T:  05/06/2011  Job:  [id number]  Electronically Signed by Wardell Heath Jenesis Martin  on 05/07/2011 08:09:13 PM

## 2011-05-14 NOTE — Op Note (Signed)
  NAME:  ELLE, VEZINA NO.:  [id number]  MEDICAL RECORD NO.:  [id number]  LOCATION:                                 FACILITY:  PHYSICIAN:  Nadara Mustard, MD     DATE OF BIRTH:  Dec 20, 1920  DATE OF PROCEDURE:  05/04/2011 DATE OF DISCHARGE:                              OPERATIVE REPORT   PREOPERATIVE DIAGNOSIS:  Right femur subtrochanteric femur fracture.  POSTOPERATIVE DIAGNOSIS:  Right femur subtrochanteric femur fracture.  PROCEDURE:  Intramedullary nail right hip with Synthes nail, 100-mm spiral blade, 11 x 340-mm nail.  SURGEON:  Nadara Mustard, MD  ANESTHESIA:  General.  ESTIMATED BLOOD LOSS:  Minimal.  ANTIBIOTICS:  Clindamycin 600 mg IV.  DRAINS:  None.  COMPLICATIONS:  None.  DISPOSITION:  To PACU in stable condition.  INDICATION FOR PROCEDURE:  The patient is an 77 year old woman with a right subtrochanteric femur fracture which she sustained while falling while trying to harvest apples from a tree.  She was admitted, medically stabilized, underwent a cardiac consult and presents at this time for surgical intervention.  She does have aortic stenosis and Anesthesia felt that she would be best served with a general rather than a spinal which would tend to drop her peripheral pressures.  Risks and benefits of surgery were discussed with the patient and her family including infection, neurovascular injury, nonhealing of the bone, mortality complications.  The patient and family state they understand and wished to proceed at this time.  DESCRIPTION OF PROCEDURE:  The patient was brought to OR room 5 and underwent a general anesthetic.  After adequate level of anesthesia was obtained, the patient was placed in the [place] fracture table.  The right lower extremity was placed in boot traction and the left lower extremity was placed in the dorsal lithotomy position, they were both well padded.  The fracture was reduced.  The patient's right  lower extremity was then prepped using DuraPrep and draped into a sterile field using the shower curtain.  A proximal incision was made proximal to the greater trochanter and guidewire was inserted down the femoral canal from the greater trochanter.  This was overdrilled.  A guidewire was placed down the shaft and this was reamed to 12 mm for a 11 mm nail. The 11 x 340-mm nail was then inserted over the wire, the wire was removed.  The lateral guide was used to place a guide pin just posterior and center of the femoral head.  The cortex was drilled and a 100-mm spiral blade was placed.  This was locked proximally.  C-arm fluoroscopy verified reduction of the alignment.  The wound was irrigated with normal saline.  Subcu was closed using #1 Vicryl.  Skin was closed with approximating staples. The wound was covered with Mepilex dressing, ABD, Hypafix tape.  The patient was extubated, taken to PACU in stable condition.     Nadara Mustard, MD     MVD/MEDQ  D:  05/04/2011  T:  05/05/2011  Job:  [id number]  Electronically Signed by Aldean Baker MD on 05/14/2011 06:19:01 AM

## 2011-06-02 NOTE — H&P (Signed)
NAME:  Sandra [name], Sandra [name] NO.:  [id number]  MEDICAL RECORD NO.:  [id number]  LOCATION:  MAJO                         FACILITY:  [facility]  PHYSICIAN:  Ladell Pier, M.D.   DATE OF BIRTH:  [birth year]  DATE OF ADMISSION:  05/03/2011 DATE OF DISCHARGE:                             HISTORY & PHYSICAL   CHIEF COMPLAINT:  Asked to see the patient for right hip fracture.  HISTORY OF PRESENT ILLNESS:  The patient is an 77 year old white female with past medical history significant for hypertension, previous left hip fracture, diastolic heart failure.  Per discussion with daughter, the patient lives alone.  She normally have [place], a home health agency coming in from 3 p.m. to 7 p.m., and they live close by and they are in and out of the house.  The patient has life alert.  The patient had gone outside to pickup apples from underneath the tree when she fell in the ditch.  The mailman saw her, called 9-1-1, and she pressed the life alert button and her son was on his way over there, because the life alert button went off.  She had a family member that was there already that had just left to her about half an hour ago.  Prior to the fall, she had no chest pain, no shortness of breath, no nausea, no vomiting, no abdominal pain, no dysuria.  She uses a walker or cane.  PAST MEDICAL HISTORY: 1. Significant for valvular heart disease with moderate aortic regurg,     moderate-to-severe mitral regurg on echo September 2011, diastolic     heart failure with EF of 55%. 2. Hypertension. 3. Dementia. 4. Chronic kidney disease. 5. Hearing loss. 6. Vertigo. 7. History of pelvic fractures in August 2010. 8. Total abdominal hysterectomy.  FAMILY HISTORY:  Both parents are deceased.  Mother had heart disease. Father had stomach cancer.  SOCIAL HISTORY:  The patient lives in [place].  Family checks in on her frequently.  She lives alone.  She raises chicken.  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  She does not use alcohol or tobacco or any other drugs.  She has 3 children two girls one boy and she uses a cane and walker at times.  MEDICATIONS: 1. Caltrate 600+D every morning. 2. Lexapro 10 mg daily. 3. ICaps every morning. 4. Omeprazole 40 mg daily. 5. Meclizine 25 mg to take 1 tablet in the a.m. and half a tablet at     bedtime. 6. Gabapentin 100 mg twice daily. 7. Vitamin B12 500 mcg 1 capsule every morning. 8. Hydrocodone 5/325 every 4 hours as needed. 9. Norvasc 5 mg every morning. 10.Amlodipine 20 mg half a tablet every morning.  ALLERGIES:  PENICILLIN, ASPIRIN, HYDROCHLOROTHIAZIDE, IODINE, LISINOPRIL, and SULFA.  REVIEW OF SYSTEMS:  Negative, otherwise stated in the HPI.  PHYSICAL EXAMINATION:  VITAL SIGNS:  Temperature 98.1, pulse of 64, respirations 20, blood pressure 115/46, pulse ox 94% on room air. GENERAL:  The patient is lying on the stretcher, well-nourished white female. HEENT: Normocephalic, atraumatic.  Pupils are reactive to light without erythema. CARDIOVASCULAR:  Regular rate and rhythm. LUNGS:  Clear bilaterally.  ABDOMEN:  Positive bowel sounds. EXTREMITIES:  No edema.  LABORATORY DATA:  Chest x-ray no acute disease.  Urine 11-20 wbc's with nitrite positive.  Sodium 141, potassium 4.2, chloride 106, CO2 of 26, glucose 132, BUN 34, creatinine 1.56.  Calcium 9.0, PT 13.8, INR 1.04. WBC 7.5, hemoglobin 9.5, MCV 88, platelet 167.  Hip x-rays showed communicated right intertrochanteric hip fracture.  EKG does not show any acute ST-segment elevation or depression.  ASSESSMENT AND PLAN: 1. Right hip fracture. 2. Valvular heart disease. 3. Hypertension. 4. Chronic kidney disease. 5. Acute cystitis. 6. Dementia.  The patient will be admitted to the hospital.  We will gently hydrate her.  Orthopedics has been consulted for possible repair of the hip surgery.  We will continue her on her home medications,  did talk with Dr. Eldridge Dace over the phone who was covering for Dr. Katrinka Blazing, as the daughter stated in the past, Dr. Katrinka Blazing advised against doing general anesthesia, they did a spinal anesthesia for her previous hip surgery. Dr. Katrinka Blazing will discuss with Dr. Lajoyce Corners that the patient is high risk and most likely the best option if possible will be go back with the spinal anesthesia. Discussed this also with the patient's family.  We will put her on Cipro IV for her UTI if she is allergic to penicillin.  Kidney function, unsure of her baseline, but we will monitor kidney function.  Time spent with the patient on doing this admission is approximately 1 hour.     Ladell Pier, M.D.     [place]/MEDQ  D:  05/03/2011  T:  05/03/2011  Job:  [id number]  cc:   Burnell Blanks, MD Lyn Records, M.D.  Electronically Signed by Ladell Pier M.D. on 06/02/2011 10:27:36 PM

## 2011-08-07 IMAGING — CR DG CHEST 2V
2 series · 2 of 2 positions shown · non-contrast
Comparison: 11/25/2009.

CLINICAL DATA: Chest pain.  Short of breath.  Bilateral ankle
swelling.

CHEST - 2 VIEW

[w chest pa]
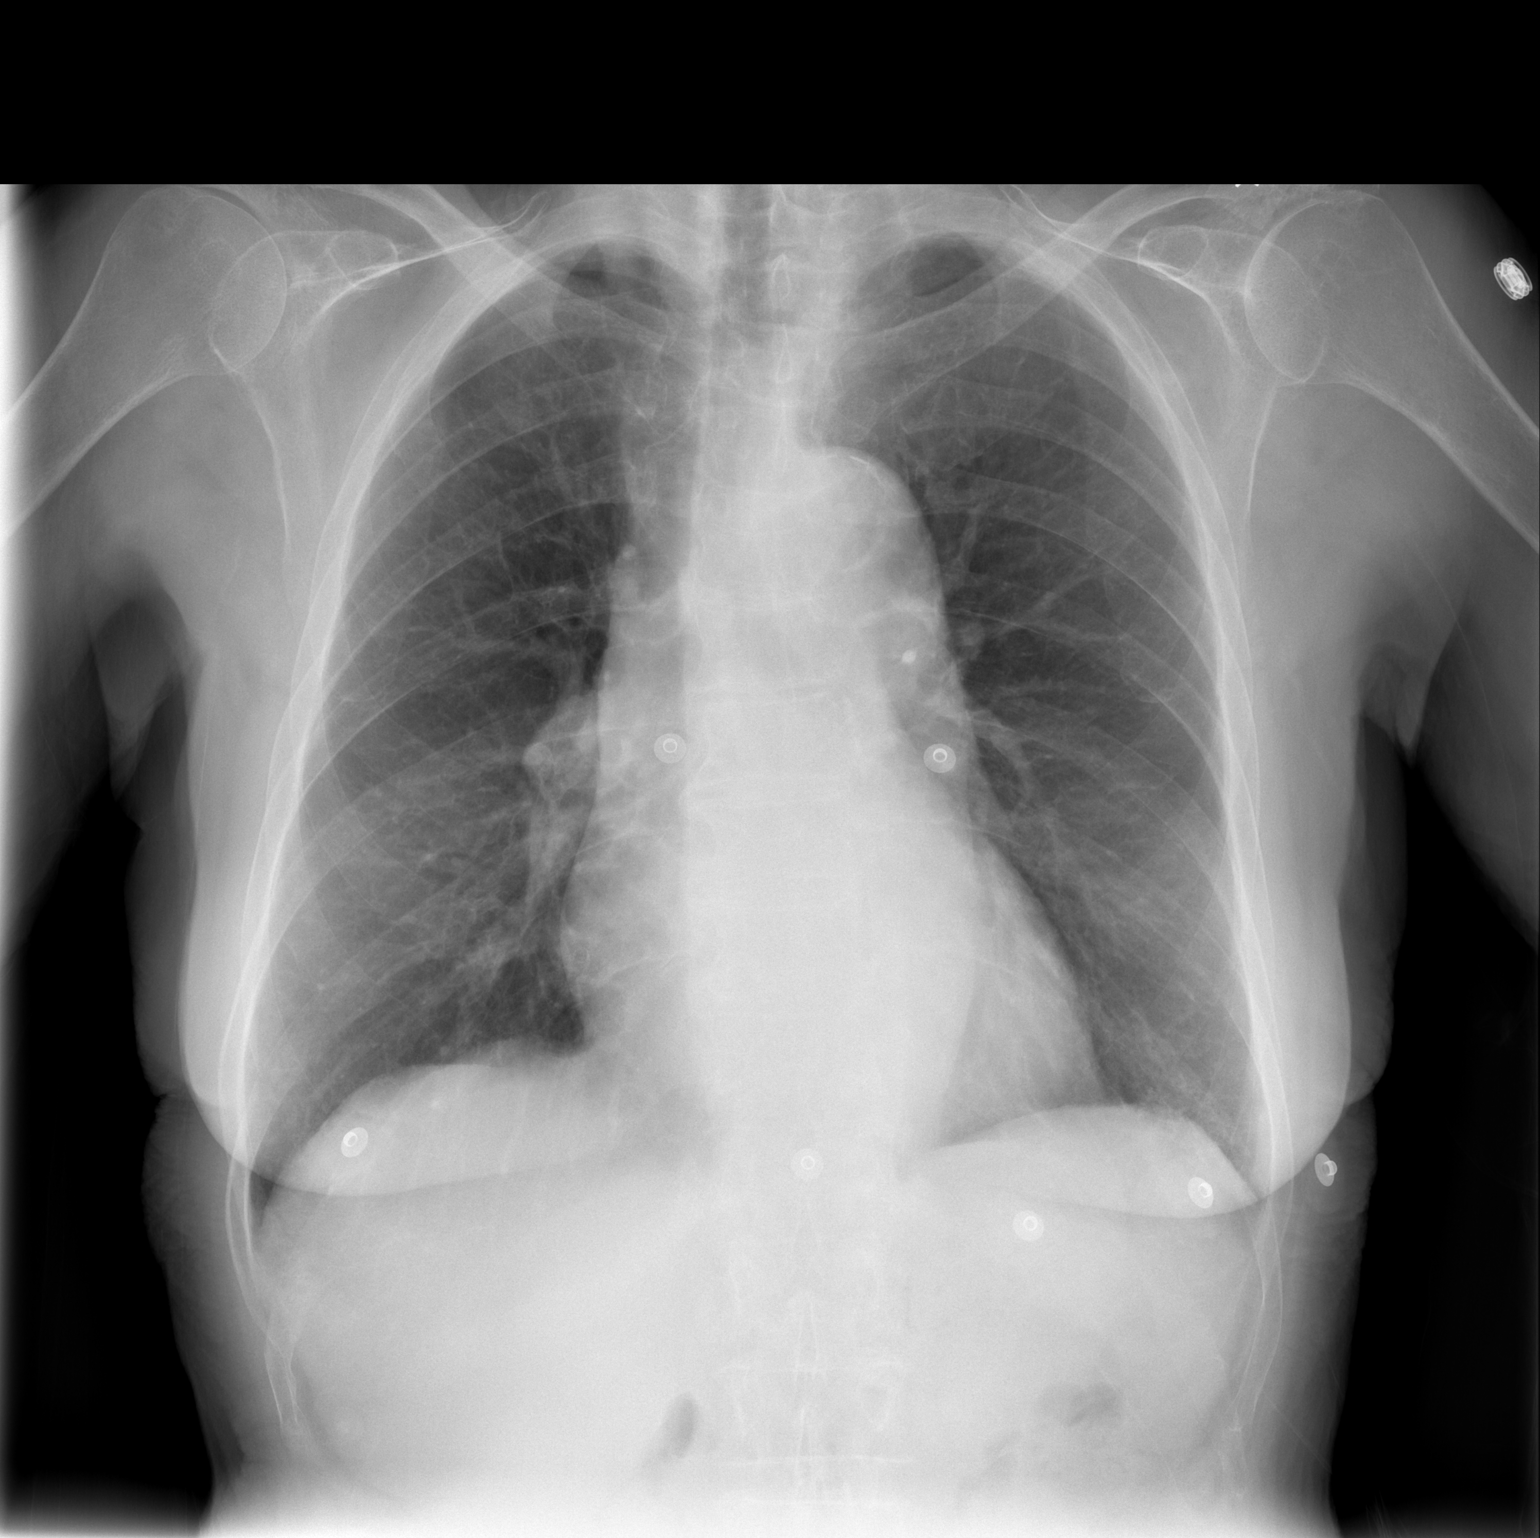

[w chest lat]
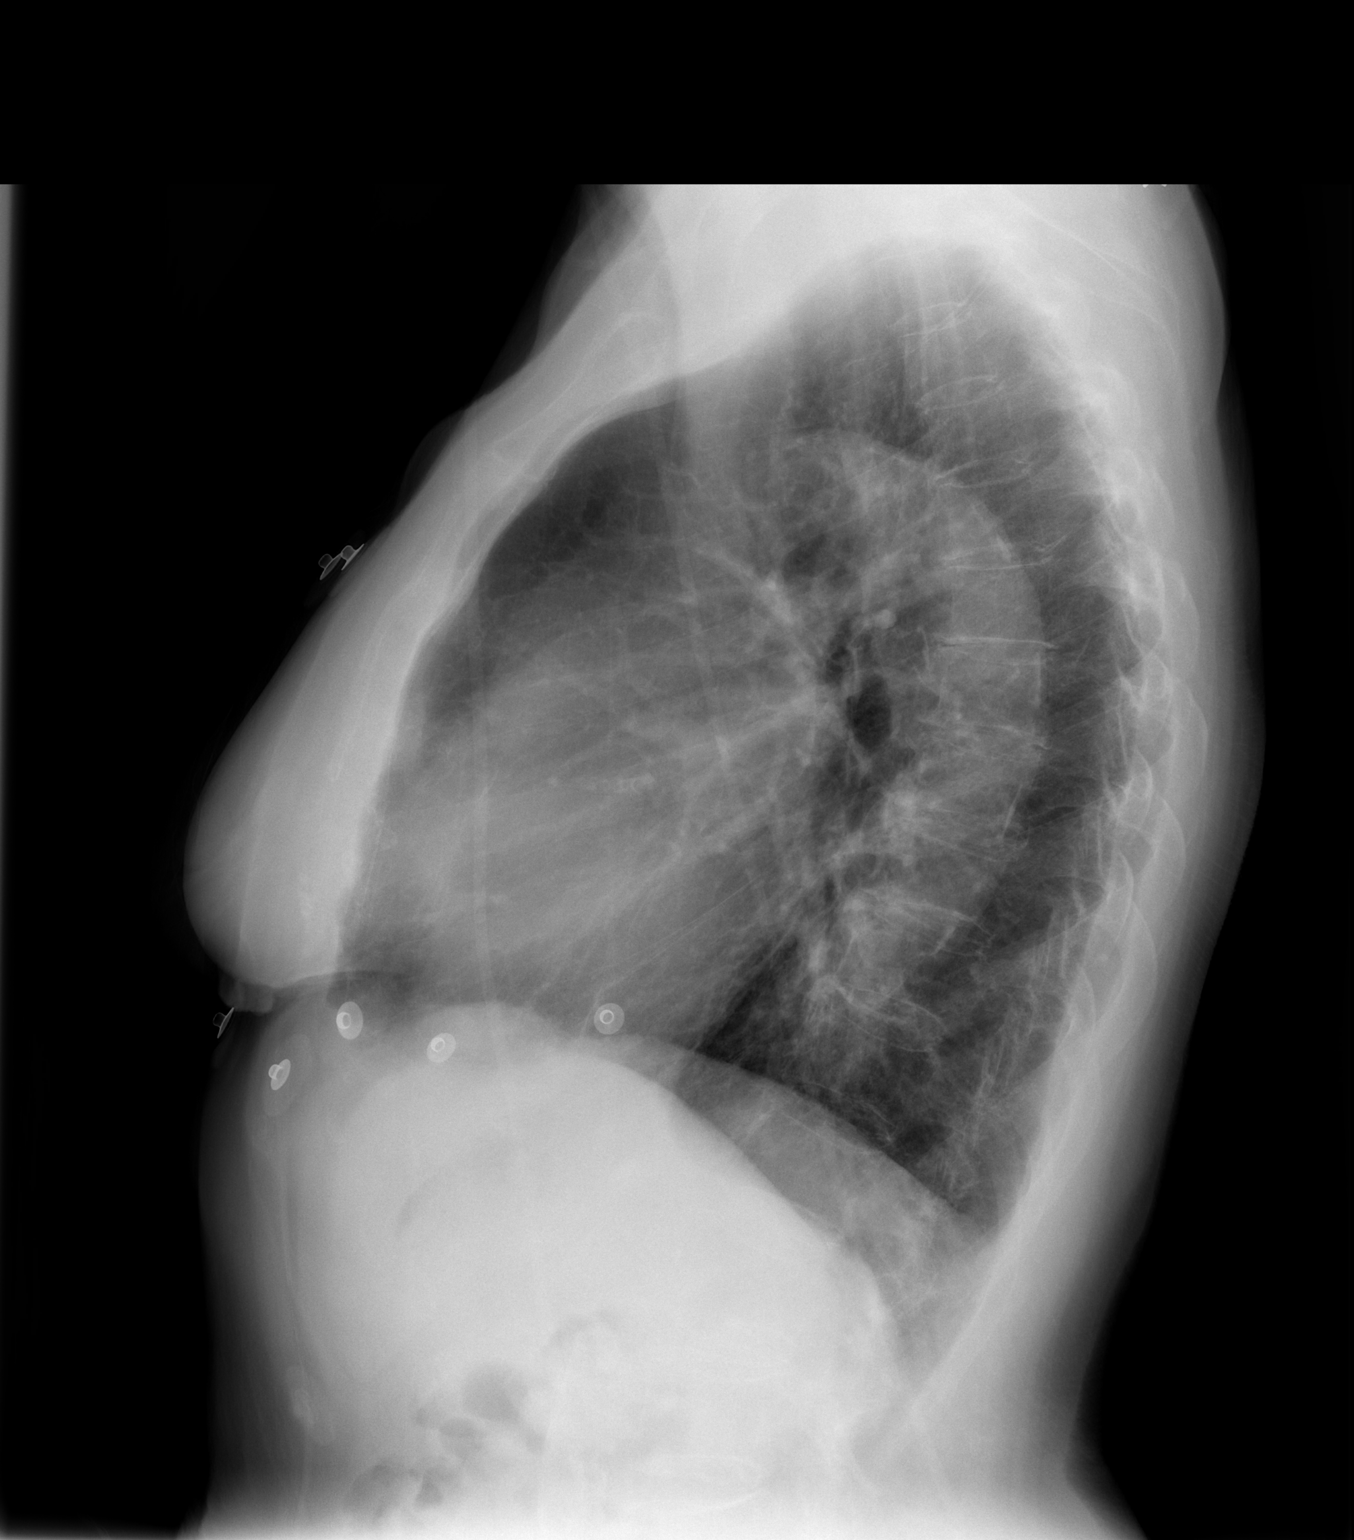

[2 of 2 positions shown; findings below may reference images not displayed]

FINDINGS: Mild cardiomegaly. No airspace disease.  No effusion.
Trachea and paratracheal stripes appear within normal limits.  Mild
rotation to the right.  Tortuous thoracic aorta with aortic arch
atherosclerosis.
IMPRESSION: No interval change or acute cardiopulmonary disease.  Mild
cardiomegaly unchanged from prior.

## 2012-01-16 ENCOUNTER — Emergency Department (HOSPITAL_COMMUNITY): Payer: Medicare Other

## 2012-01-16 ENCOUNTER — Emergency Department (HOSPITAL_COMMUNITY)
Admission: EM | Admit: 2012-01-16 | Discharge: 2012-01-16 | Disposition: A | Discharge status: Home / Self Care | Payer: Medicare Other | Attending: Emergency Medicine | Admitting: Emergency Medicine

## 2012-01-16 ENCOUNTER — Encounter (HOSPITAL_COMMUNITY): Payer: Self-pay | Admitting: *Deleted

## 2012-01-16 ENCOUNTER — Other Ambulatory Visit: Payer: Self-pay

## 2012-01-16 DIAGNOSIS — R55 Syncope and collapse: Secondary | ICD-10-CM

## 2012-01-16 DIAGNOSIS — I509 Heart failure, unspecified: Secondary | ICD-10-CM | POA: Insufficient documentation

## 2012-01-16 DIAGNOSIS — F068 Other specified mental disorders due to known physiological condition: Secondary | ICD-10-CM | POA: Insufficient documentation

## 2012-01-16 DIAGNOSIS — R5383 Other fatigue: Secondary | ICD-10-CM | POA: Insufficient documentation

## 2012-01-16 DIAGNOSIS — E785 Hyperlipidemia, unspecified: Secondary | ICD-10-CM | POA: Insufficient documentation

## 2012-01-16 DIAGNOSIS — R0602 Shortness of breath: Secondary | ICD-10-CM | POA: Insufficient documentation

## 2012-01-16 DIAGNOSIS — Z79899 Other long term (current) drug therapy: Secondary | ICD-10-CM | POA: Insufficient documentation

## 2012-01-16 DIAGNOSIS — R5381 Other malaise: Secondary | ICD-10-CM | POA: Insufficient documentation

## 2012-01-16 DIAGNOSIS — I10 Essential (primary) hypertension: Secondary | ICD-10-CM | POA: Insufficient documentation

## 2012-01-16 HISTORY — DX: Hyperlipidemia, unspecified: E78.5

## 2012-01-16 HISTORY — DX: Anemia, unspecified: D64.9

## 2012-01-16 HISTORY — DX: Disorder of kidney and ureter, unspecified: N28.9

## 2012-01-16 HISTORY — DX: Diverticulosis of intestine, part unspecified, without perforation or abscess without bleeding: K57.90

## 2012-01-16 HISTORY — DX: Dizziness and giddiness: R42

## 2012-01-16 HISTORY — DX: Nonrheumatic aortic (valve) insufficiency: I35.1

## 2012-01-16 LAB — COMPREHENSIVE METABOLIC PANEL
ALT: 10 U/L (ref 0–35)
AST: 18 U/L (ref 0–37)
Albumin: 3.3 g/dL — ABNORMAL LOW (ref 3.5–5.2)
Alkaline Phosphatase: 74 U/L (ref 39–117)
CO2: 23 mEq/L (ref 19–32)
Chloride: 103 mEq/L (ref 96–112)
GFR calc non Af Amer: 37 mL/min — ABNORMAL LOW (ref >90)
Potassium: 3.8 mEq/L (ref 3.5–5.1)
Total Bilirubin: 0.6 mg/dL (ref 0.3–1.2)

## 2012-01-16 LAB — POCT I-STAT, CHEM 8
BUN: 27 mg/dL — ABNORMAL HIGH (ref 6–23)
Chloride: 107 mEq/L (ref 96–112)
Creatinine, Ser: 1.4 mg/dL — ABNORMAL HIGH (ref 0.50–1.10)
Glucose, Bld: 130 mg/dL — ABNORMAL HIGH (ref 70–99)
Hemoglobin: 11.2 g/dL — ABNORMAL LOW (ref 12.0–15.0)
Potassium: 3.8 mEq/L (ref 3.5–5.1)
Sodium: 142 mEq/L (ref 135–145)

## 2012-01-16 LAB — URINALYSIS, ROUTINE W REFLEX MICROSCOPIC
Nitrite: NEGATIVE
Specific Gravity, Urine: 1.017 (ref 1.005–1.030)
Urobilinogen, UA: 0.2 mg/dL (ref 0.0–1.0)
pH: 5.5 (ref 5.0–8.0)

## 2012-01-16 LAB — APTT: aPTT: 29 seconds (ref 24–37)

## 2012-01-16 LAB — DIFFERENTIAL
Basophils Absolute: 0 10*3/uL (ref 0.0–0.1)
Basophils Relative: 0 % (ref 0–1)
Lymphocytes Relative: 23 % (ref 12–46)
Monocytes Absolute: 0.8 10*3/uL (ref 0.1–1.0)
Monocytes Relative: 9 % (ref 3–12)
Neutro Abs: 5.9 10*3/uL (ref 1.7–7.7)
Neutrophils Relative %: 64 % (ref 43–77)

## 2012-01-16 LAB — PROTIME-INR: INR: 1.01 (ref 0.00–1.49)

## 2012-01-16 LAB — URINE MICROSCOPIC-ADD ON

## 2012-01-16 LAB — CBC
HCT: 32.1 % — ABNORMAL LOW (ref 36.0–46.0)
Hemoglobin: 10.7 g/dL — ABNORMAL LOW (ref 12.0–15.0)
MCHC: 33.3 g/dL (ref 30.0–36.0)
RDW: 12.3 % (ref 11.5–15.5)
WBC: 9.3 10*3/uL (ref 4.0–10.5)

## 2012-01-16 NOTE — ED Notes (Signed)
Encode Code stroke 1927, Pt arrived at 19:30 EDP arrival at 19:30 to bridge for exam, code stroke cancelled at 19:37.

## 2012-01-16 NOTE — ED Notes (Signed)
Patient alertx4, NAD. Family states patient does have a history of non symptomatic UTIs and dehydration.

## 2012-01-16 NOTE — ED Notes (Signed)
Pt arrived to E.D. As a possible code stroke due to vague symptoms. Pt arrived to ED and at [facility] and EDP exam code stroke cancelled. Per EMS. Pt was cooking biscuts and she became weak, and dizzy. Pt has history of Vertigo. Pt is from nursing home but was home for the weekend to celebrate her birthday. Pt denies and numbness, tingling, or HA at this time. Pt complaining of generalized weakness. CBG 171 with EMS.

## 2012-01-16 NOTE — Discharge Instructions (Signed)
Mrs. Picazo had a fainting spell this evening when she was trying to cook something.  She had physical examination, laboratory tests, EKG, and CT x-ray of the brain to check on her for this problem.  All of her tests were good. It is safe for her to return to her facility and to resume her regular medicines.

## 2012-01-16 NOTE — ED Provider Notes (Cosign Needed)
History     CSN: [id number]  Arrival date & time 01/16/12  4098   First MD Initiated Contact with Patient 01/16/12 2025      Chief Complaint  Patient presents with  . Weakness    (Consider location/radiation/quality/duration/timing/severity/associated sxs/prior treatment) HPI Comments: Patient is an 78 year old woman who lives in a nursing home. She had been taken to her own home to celebrate her 89th birthday. While there she was standing to try and Coke felt weak, sat down and fainted for about 5 minutes. She denies chest pain. There was no shortness of breath. She was brought to [facility] [place] initially as a code stroke. However her symptoms did not show any focality, and her CT of the head was negative, and so code stroke was canceled. Patient's history is given by her daughter; the patient is deaf, and also has a significant degree of dementia. She is unable to give her history or to give a review of systems.  Patient is a 78 y.o. female presenting with syncope. The history is provided by a relative and the patient. No language interpreter was used.  Loss of Consciousness This is a new problem. The current episode started less than 1 hour ago. Episode frequency: One episode of syncope this evening. The problem has been rapidly improving. Associated symptoms include shortness of breath. Pertinent negatives include no chest pain, no abdominal pain and no headaches. The symptoms are aggravated by nothing. The symptoms are relieved by nothing. Treatments tried: She was transferred to [facility] as a code stroke by EMS.    Past Medical History  Diagnosis Date  . CHF (congestive heart failure)   . Hypertension   . Dizziness   . Renal insufficiency   . Anemia   . Diverticular disease   . Hyperlipemia   . Aortic regurgitation     History reviewed. No pertinent past surgical history.  History reviewed. No pertinent family history.  History  Substance Use Topics  . Smoking  status: Not on file  . Smokeless tobacco: Not on file  . Alcohol Use:     OB History    Grav Para Term Preterm Abortions TAB SAB Ect Mult Living                  Review of Systems  Unable to perform ROS: Dementia  Respiratory: Positive for shortness of breath.   Cardiovascular: Positive for syncope. Negative for chest pain.  Gastrointestinal: Negative for abdominal pain.  Neurological: Negative for headaches.    Allergies  Ambien; Aspirin; Hydrochlorothiazide; Influenza vaccines; Penicillins; and Sulfa antibiotics  Home Medications   Current Outpatient Rx  Name Route Sig Dispense Refill  . ACETAMINOPHEN 325 MG PO TABS Oral Take 650 mg by mouth once as needed. Take in the morning if needed for pain    . AMLODIPINE BESYLATE 5 MG PO TABS Oral Take 5 mg by mouth every morning.    Marland Kitchen CALCIUM CARBONATE-VITAMIN D 600-400 MG-UNIT PO TABS Oral Take 1 tablet by mouth every morning.    Marland Kitchen CYANOCOBALAMIN 500 MCG PO TABS Oral Take 500 mcg by mouth every morning.    Marland Kitchen DOCUSATE SODIUM 100 MG PO CAPS Oral Take 100 mg by mouth 2 (two) times daily as needed. For constipation    . DONEPEZIL HCL 5 MG PO TABS Oral Take 5 mg by mouth at bedtime.    Marland Kitchen ESCITALOPRAM OXALATE 10 MG PO TABS Oral Take 10 mg by mouth every morning.    Marland Kitchen  FUROSEMIDE 20 MG PO TABS Oral Take 20 mg by mouth every other day.    Marland Kitchen GABAPENTIN 100 MG PO CAPS Oral Take 100 mg by mouth 2 (two) times daily.    Marland Kitchen HYDROCODONE-ACETAMINOPHEN 5-325 MG PO TABS Oral Take 1 tablet by mouth every 4 (four) hours as needed. For pain    . HYDROCODONE-ACETAMINOPHEN 5-325 MG PO TABS Oral Take 2 tablets by mouth every 4 (four) hours as needed. For pain    . MECLIZINE HCL 12.5 MG PO TABS Oral Take 12.5 mg by mouth at bedtime.    Marland Kitchen MECLIZINE HCL 25 MG PO TABS Oral Take 25 mg by mouth every morning.    . ICAPS PO Oral Take 1 capsule by mouth every morning.    Marland Kitchen OMEPRAZOLE 40 MG PO CPDR Oral Take 40 mg by mouth every morning.      BP 146/50  Pulse 72   Temp(Src) 98.3 F (36.8 C) (Oral)  Resp 23  SpO2 100%  Physical Exam  Nursing note and vitals reviewed. Constitutional: She is oriented to person, place, and time. She appears well-developed and well-nourished.       Patient is a pleasant elderly lady in no distress. She is quite deaf, despite the use of hearing aids.  HENT:  Head: Normocephalic and atraumatic.  Mouth/Throat: Oropharynx is clear and moist.  Eyes: Conjunctivae and EOM are normal. Pupils are equal, round, and reactive to light.  Neck: Normal range of motion. Neck supple.       No carotid bruit.  Cardiovascular: Normal rate, regular rhythm and normal heart sounds.   Pulmonary/Chest: Effort normal and breath sounds normal.  Abdominal: Soft. Bowel sounds are normal.  Musculoskeletal: Normal range of motion. She exhibits no edema and no tenderness.  Neurological: She is alert and oriented to person, place, and time.       No sensory or motor deficit.  Skin: Skin is warm and dry.  Psychiatric: She has a normal mood and affect. Her behavior is normal.    ED Course  Procedures (including critical care time)  Labs Reviewed  CBC - Abnormal; Notable for the following:    RBC 3.62 (*)    Hemoglobin 10.7 (*)    HCT 32.1 (*)    All other components within normal limits  COMPREHENSIVE METABOLIC PANEL - Abnormal; Notable for the following:    Glucose, Bld 126 (*)    BUN 26 (*)    Creatinine, Ser 1.25 (*)    Albumin 3.3 (*)    GFR calc non Af Amer 37 (*)    GFR calc Af Amer 43 (*)    All other components within normal limits  POCT I-STAT, CHEM 8 - Abnormal; Notable for the following:    BUN 27 (*)    Creatinine, Ser 1.40 (*)    Glucose, Bld 130 (*)    Hemoglobin 11.2 (*)    HCT 33.0 (*)    All other components within normal limits  PROTIME-INR  APTT  DIFFERENTIAL  CK TOTAL AND CKMB  TROPONIN I   Ct Head Wo Contrast  01/16/2012  *RADIOLOGY REPORT*  Clinical Data: New onset generalized weakness.  CT HEAD WITHOUT  CONTRAST  Technique:  Contiguous axial images were obtained from the base of the skull through the vertex without contrast.  Comparison: Head CT scan 02/05/2011.  Findings: Again seen are cortical atrophy and chronic microvascular ischemic change.  No evidence of acute abnormality including infarction, hemorrhage, mass lesion, mass effect, midline  shift or abnormal extra-axial fluid collection.  No hydrocephalus or pneumocephalus.  Calvarium intact.  Imaged paranasal sinuses mastoid air cells are clear.  IMPRESSION: No acute finding.  Atrophy and chronic microvascular ischemic change.  Original Report Authenticated By: Bernadene Bell. D'ALESSIO, M.D.   8:26 PM  Date: 01/16/2012  Rate: 73  Rhythm: normal sinus rhythm and premature ventricular contractions (PVC)  QRS Axis: normal  Intervals: QT prolonged QRS:  Left ventricular hypertrophy  ST/T Wave abnormalities: nonspecific ST/T changes  Conduction Disutrbances:none  Narrative Interpretation: Abnormal EKG.  Old EKG Reviewed: unchanged  11:15 PM Patient had physical exam, CT of the brain, EKG, and laboratory testing. All of this was normal. She had orthostatic vital signs which were negative. She seemed to fully recovered from her syncope, and was released back to her nursing home.    1. Vasovagal syncope            Carleene Cooper III, MD 01/16/12 2316

## 2012-01-19 LAB — URINE CULTURE: Culture  Setup Time: 201304220245

## 2012-01-20 NOTE — ED Notes (Signed)
+   Urine Chart sent to EDP office for review. 

## 2012-01-22 NOTE — ED Notes (Signed)
Chart back from EDP office; Rx for Cipro 500mg  PO BID x 7days per [facility]

## 2012-02-03 NOTE — ED Notes (Signed)
Unable to contact after 3 attempts.

## 2012-05-18 DIAGNOSIS — M79609 Pain in unspecified limb: Secondary | ICD-10-CM | POA: Diagnosis not present

## 2012-05-18 DIAGNOSIS — B351 Tinea unguium: Secondary | ICD-10-CM | POA: Diagnosis not present

## 2012-07-13 IMAGING — CR DG CHEST 1V
1 series · 1 of 1 positions shown · non-contrast
Comparison: 07/26/2010

CLINICAL DATA: Preoperative exam, right hip fracture

CHEST - 1 VIEW

[t chest supine]
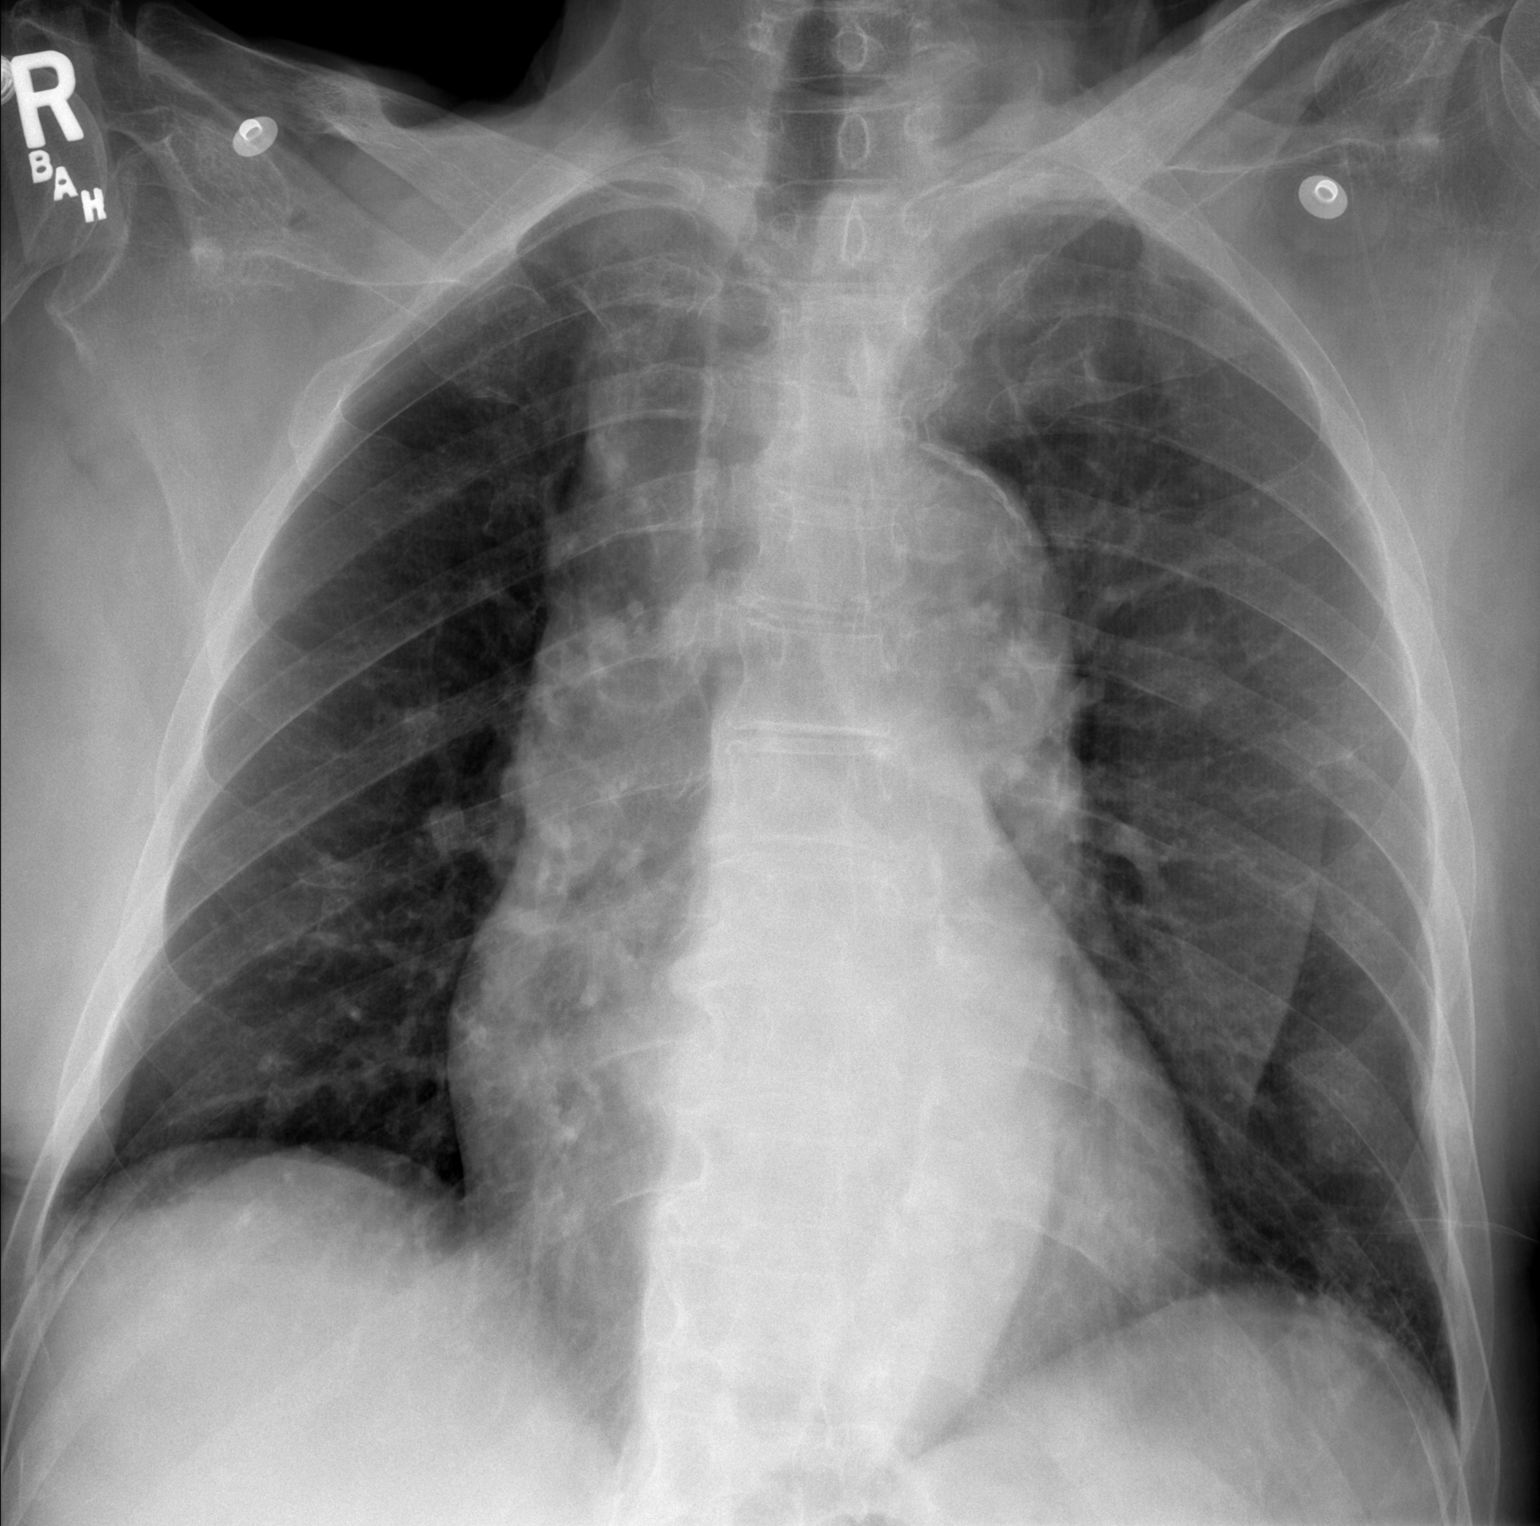

[1 of 1 positions shown; findings below may reference images not displayed]

FINDINGS: Skin folds project over the left chest.  Mild enlargement
cardiomediastinal silhouette is noted without edema. Aorta is
ectatic and unfolded.  Vascular calcifications are noted without
calcified aneurysm.  The lungs are clear.  No pleural effusion.
IMPRESSION: No acute cardiopulmonary process.

## 2012-07-27 DIAGNOSIS — M79609 Pain in unspecified limb: Secondary | ICD-10-CM | POA: Diagnosis not present

## 2012-07-27 DIAGNOSIS — B351 Tinea unguium: Secondary | ICD-10-CM | POA: Diagnosis not present

## 2012-10-11 DIAGNOSIS — B351 Tinea unguium: Secondary | ICD-10-CM | POA: Diagnosis not present

## 2012-10-11 DIAGNOSIS — M79609 Pain in unspecified limb: Secondary | ICD-10-CM | POA: Diagnosis not present

## 2013-01-03 DIAGNOSIS — M79609 Pain in unspecified limb: Secondary | ICD-10-CM | POA: Diagnosis not present

## 2013-01-03 DIAGNOSIS — B351 Tinea unguium: Secondary | ICD-10-CM | POA: Diagnosis not present

## 2013-03-14 DIAGNOSIS — M79609 Pain in unspecified limb: Secondary | ICD-10-CM | POA: Diagnosis not present

## 2013-03-14 DIAGNOSIS — B351 Tinea unguium: Secondary | ICD-10-CM | POA: Diagnosis not present

## 2013-03-28 IMAGING — CT CT HEAD W/O CM
1 series · 16 of 30 positions shown, 20 images · non-contrast
Comparison: Head CT scan 02/05/2011.

CLINICAL DATA: New onset generalized weakness.

CT HEAD WITHOUT CONTRAST
TECHNIQUE: Contiguous axial images were obtained from the base of
the skull through the vertex without contrast.

[Series 2: head routine 4.8 h37s · axial · 0.43mm/px · z∈[-134,-5]mm · 16 of 30 slices shown, 20 images]
[im 2/30  brain]
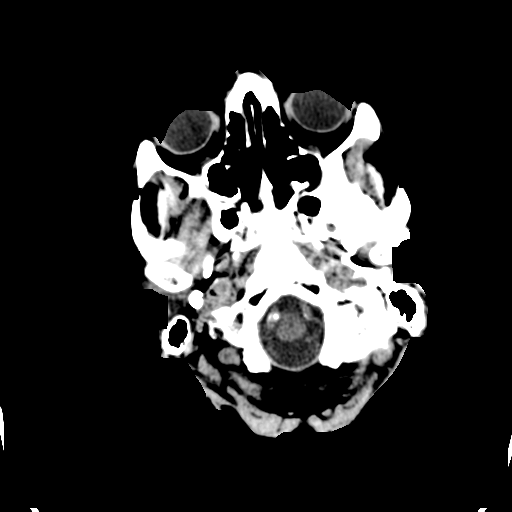
[im 2/30  bone]
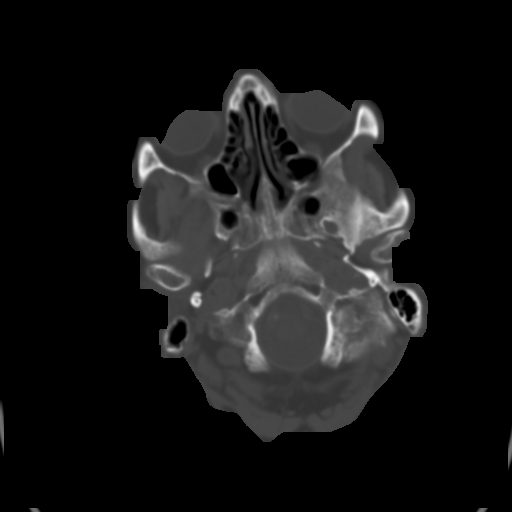
[im 4/30  brain]
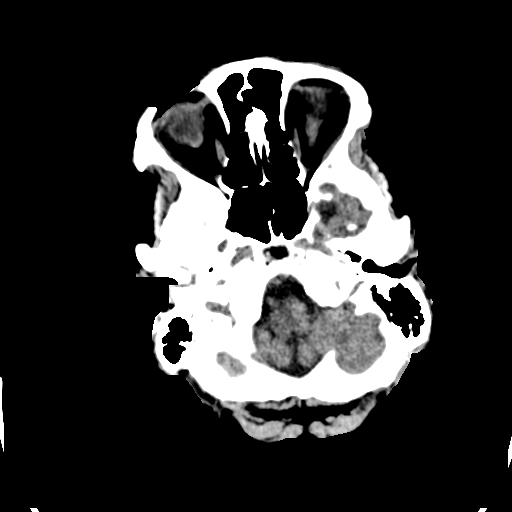
[im 6/30  brain]
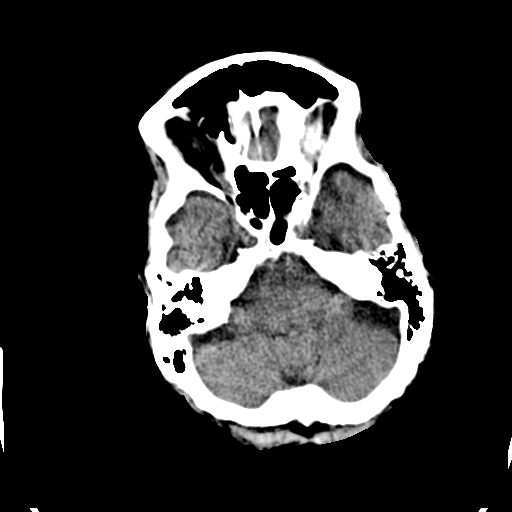
[im 8/30  brain]
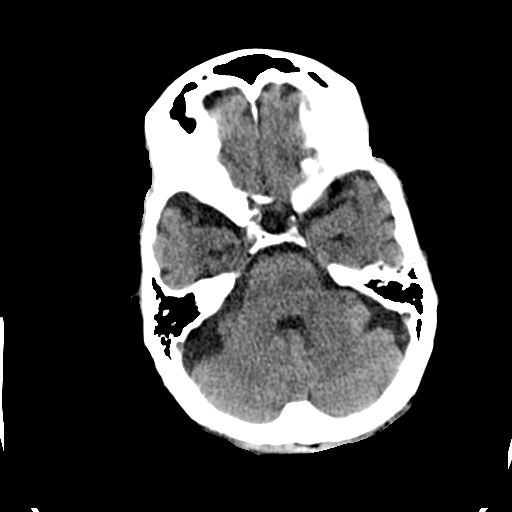
[im 9/30  brain]
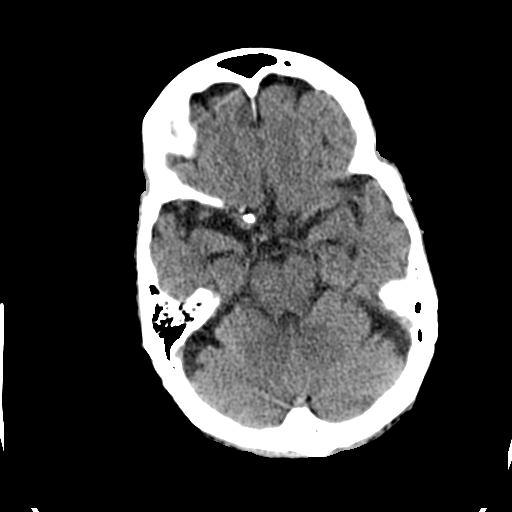
[im 9/30  bone]
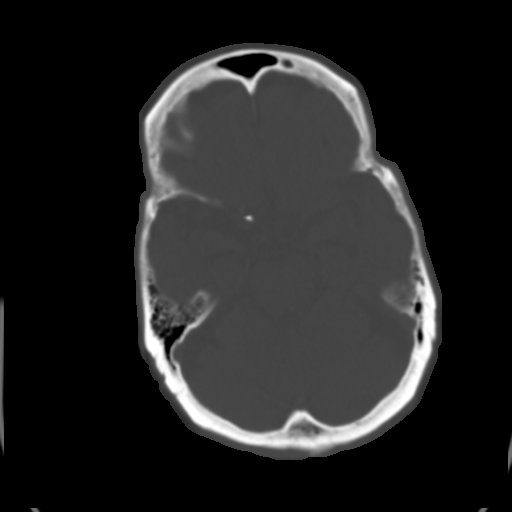
[im 11/30  brain]
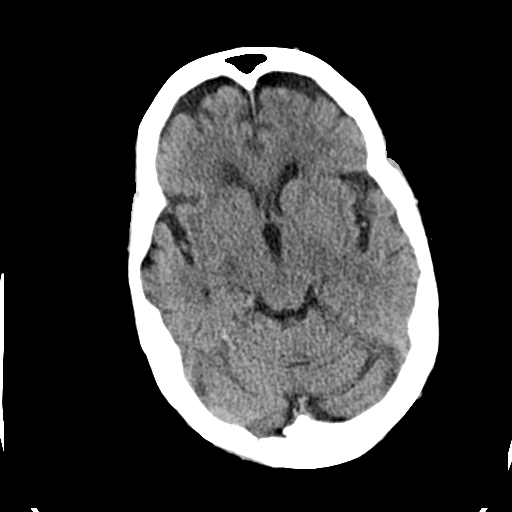
[im 13/30  brain]
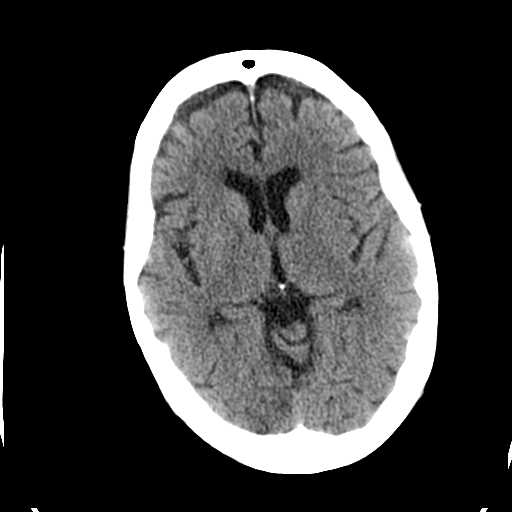
[im 15/30  brain]
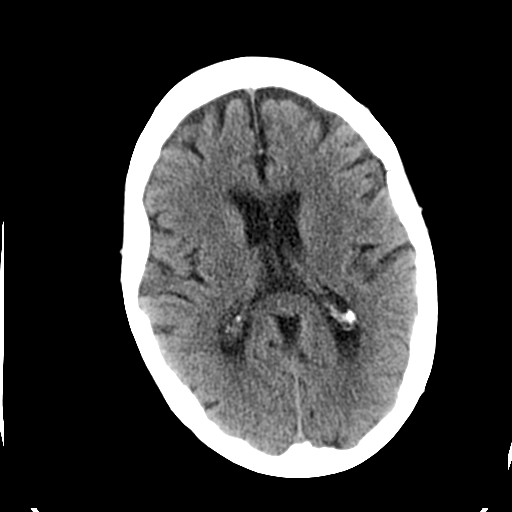
[im 16/30  brain]
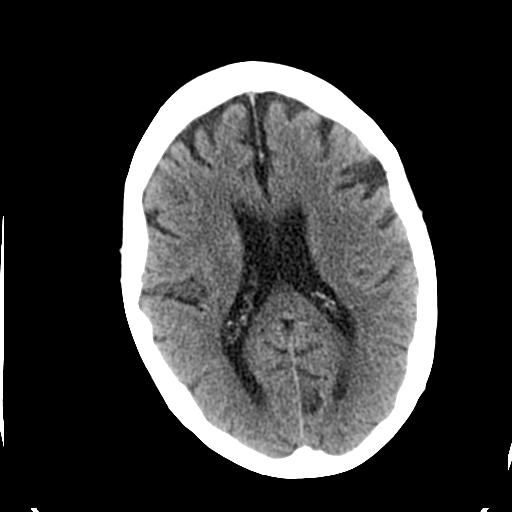
[im 16/30  bone]
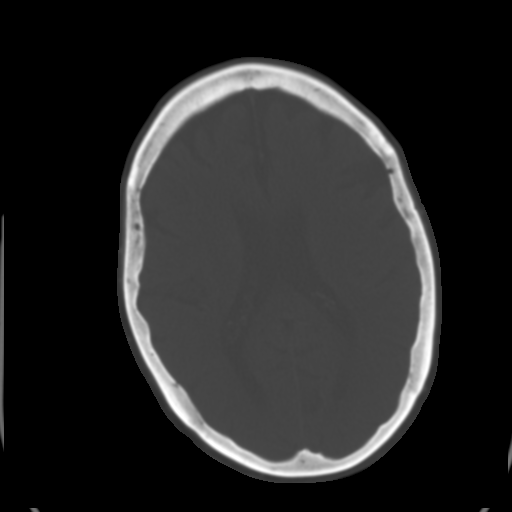
[im 18/30  brain]
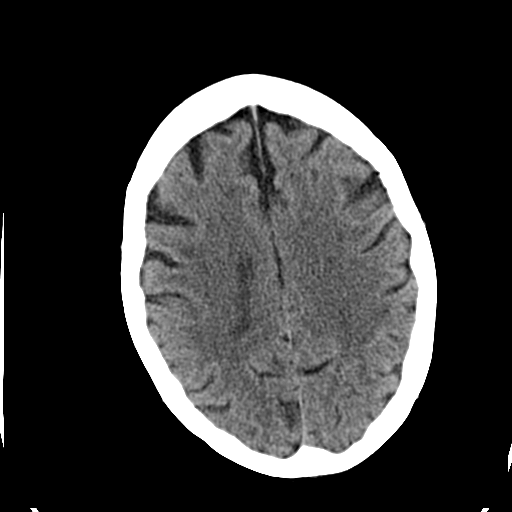
[im 20/30  brain]
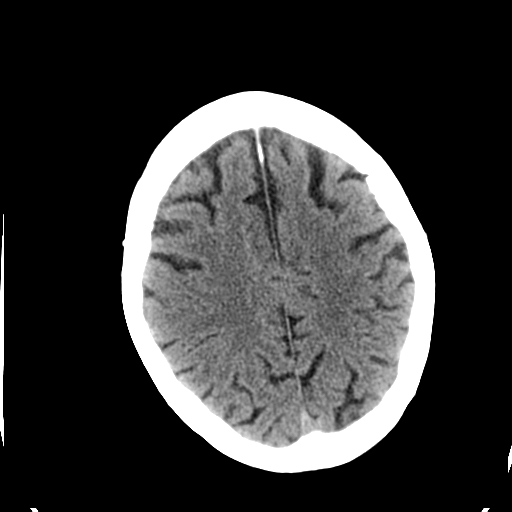
[im 22/30  brain]
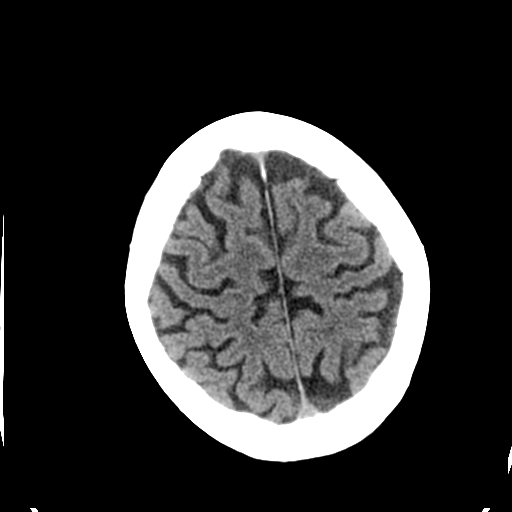
[im 23/30  brain]
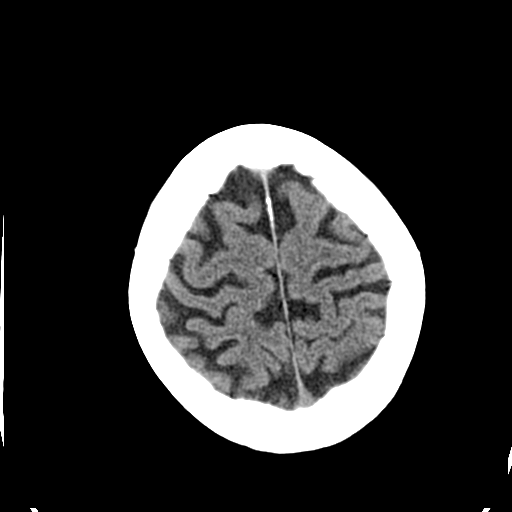
[im 23/30  bone]
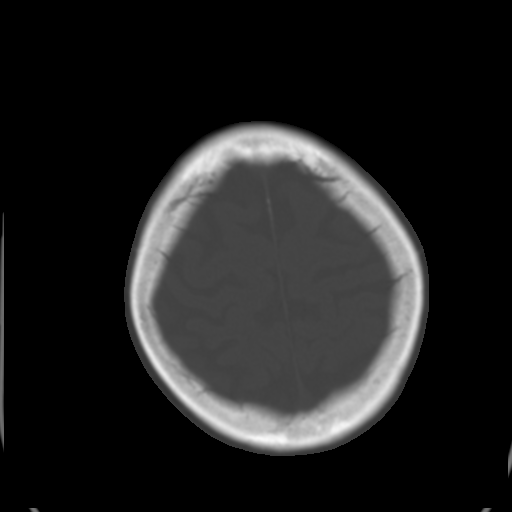
[im 25/30  brain]
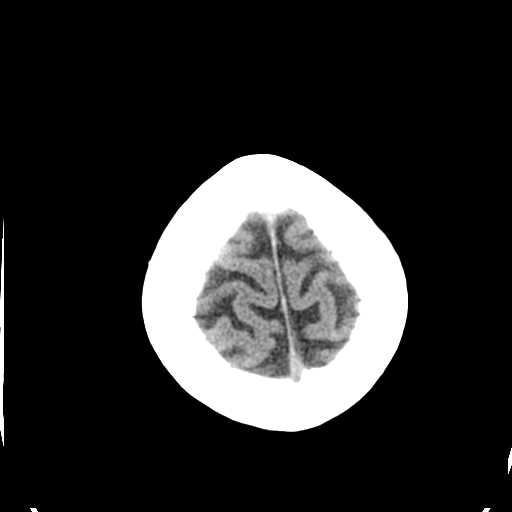
[im 27/30  brain]
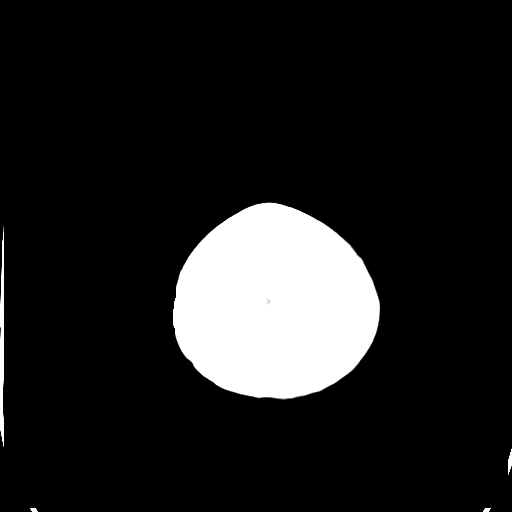
[im 29/30  brain]
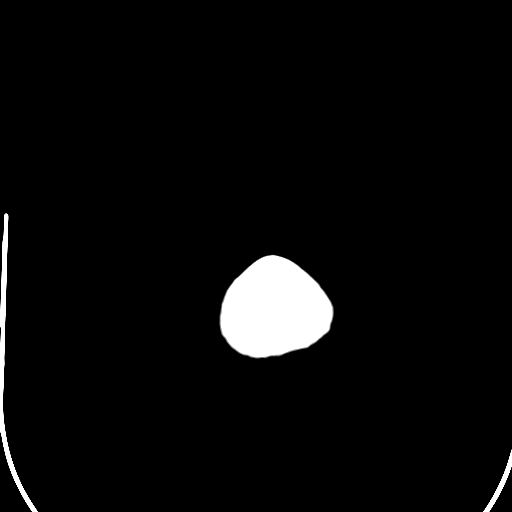

[16 of 30 positions shown; findings below may reference images not displayed]

FINDINGS: Again seen are cortical atrophy and chronic microvascular
ischemic change.  No evidence of acute abnormality including
infarction, hemorrhage, mass lesion, mass effect, midline shift or
abnormal extra-axial fluid collection.  No hydrocephalus or
pneumocephalus.  Calvarium intact.  Imaged paranasal sinuses
mastoid air cells are clear.
IMPRESSION: No acute finding.  Atrophy and chronic microvascular ischemic
change.

## 2013-05-16 DIAGNOSIS — M79609 Pain in unspecified limb: Secondary | ICD-10-CM | POA: Diagnosis not present

## 2013-05-16 DIAGNOSIS — B351 Tinea unguium: Secondary | ICD-10-CM | POA: Diagnosis not present

## 2013-07-18 DIAGNOSIS — M79609 Pain in unspecified limb: Secondary | ICD-10-CM | POA: Diagnosis not present

## 2013-07-18 DIAGNOSIS — B351 Tinea unguium: Secondary | ICD-10-CM | POA: Diagnosis not present

## 2013-10-17 DIAGNOSIS — M79609 Pain in unspecified limb: Secondary | ICD-10-CM | POA: Diagnosis not present

## 2013-10-17 DIAGNOSIS — B351 Tinea unguium: Secondary | ICD-10-CM | POA: Diagnosis not present

## 2013-10-23 DIAGNOSIS — C44319 Basal cell carcinoma of skin of other parts of face: Secondary | ICD-10-CM | POA: Diagnosis not present

## 2013-10-23 DIAGNOSIS — L821 Other seborrheic keratosis: Secondary | ICD-10-CM | POA: Diagnosis not present

## 2013-11-08 DIAGNOSIS — D649 Anemia, unspecified: Secondary | ICD-10-CM | POA: Diagnosis not present

## 2013-11-08 DIAGNOSIS — I509 Heart failure, unspecified: Secondary | ICD-10-CM | POA: Diagnosis not present

## 2013-11-08 DIAGNOSIS — Z79899 Other long term (current) drug therapy: Secondary | ICD-10-CM | POA: Diagnosis not present

## 2013-11-15 DIAGNOSIS — Z79899 Other long term (current) drug therapy: Secondary | ICD-10-CM | POA: Diagnosis not present

## 2013-12-11 DIAGNOSIS — R609 Edema, unspecified: Secondary | ICD-10-CM | POA: Diagnosis not present

## 2013-12-11 DIAGNOSIS — R05 Cough: Secondary | ICD-10-CM | POA: Diagnosis not present

## 2013-12-11 DIAGNOSIS — R4182 Altered mental status, unspecified: Secondary | ICD-10-CM | POA: Diagnosis not present

## 2013-12-11 DIAGNOSIS — R062 Wheezing: Secondary | ICD-10-CM | POA: Diagnosis not present

## 2013-12-11 DIAGNOSIS — R059 Cough, unspecified: Secondary | ICD-10-CM | POA: Diagnosis not present

## 2013-12-11 DIAGNOSIS — I10 Essential (primary) hypertension: Secondary | ICD-10-CM | POA: Diagnosis not present

## 2013-12-12 DIAGNOSIS — Z79899 Other long term (current) drug therapy: Secondary | ICD-10-CM | POA: Diagnosis not present

## 2014-01-10 DIAGNOSIS — B351 Tinea unguium: Secondary | ICD-10-CM | POA: Diagnosis not present

## 2014-01-10 DIAGNOSIS — M79609 Pain in unspecified limb: Secondary | ICD-10-CM | POA: Diagnosis not present

## 2014-02-04 DIAGNOSIS — H3530 Unspecified macular degeneration: Secondary | ICD-10-CM | POA: Diagnosis not present

## 2014-03-14 DIAGNOSIS — B351 Tinea unguium: Secondary | ICD-10-CM | POA: Diagnosis not present

## 2014-03-14 DIAGNOSIS — M79609 Pain in unspecified limb: Secondary | ICD-10-CM | POA: Diagnosis not present

## 2014-03-19 DIAGNOSIS — N183 Chronic kidney disease, stage 3 unspecified: Secondary | ICD-10-CM | POA: Diagnosis not present

## 2014-03-19 DIAGNOSIS — F0280 Dementia in other diseases classified elsewhere without behavioral disturbance: Secondary | ICD-10-CM | POA: Diagnosis not present

## 2014-03-19 DIAGNOSIS — G309 Alzheimer's disease, unspecified: Secondary | ICD-10-CM | POA: Diagnosis not present

## 2014-03-19 DIAGNOSIS — I10 Essential (primary) hypertension: Secondary | ICD-10-CM | POA: Diagnosis not present

## 2014-03-19 DIAGNOSIS — R609 Edema, unspecified: Secondary | ICD-10-CM | POA: Diagnosis not present

## 2014-05-02 DIAGNOSIS — I509 Heart failure, unspecified: Secondary | ICD-10-CM | POA: Diagnosis not present

## 2014-05-02 DIAGNOSIS — Z79899 Other long term (current) drug therapy: Secondary | ICD-10-CM | POA: Diagnosis not present

## 2014-05-03 DIAGNOSIS — R609 Edema, unspecified: Secondary | ICD-10-CM | POA: Diagnosis not present

## 2014-05-03 DIAGNOSIS — I10 Essential (primary) hypertension: Secondary | ICD-10-CM | POA: Diagnosis not present

## 2014-05-03 DIAGNOSIS — N183 Chronic kidney disease, stage 3 unspecified: Secondary | ICD-10-CM | POA: Diagnosis not present

## 2014-05-06 DIAGNOSIS — Z79899 Other long term (current) drug therapy: Secondary | ICD-10-CM | POA: Diagnosis not present

## 2014-05-21 DIAGNOSIS — Z79899 Other long term (current) drug therapy: Secondary | ICD-10-CM | POA: Diagnosis not present

## 2014-06-11 DIAGNOSIS — R609 Edema, unspecified: Secondary | ICD-10-CM | POA: Diagnosis not present

## 2014-06-11 DIAGNOSIS — N183 Chronic kidney disease, stage 3 unspecified: Secondary | ICD-10-CM | POA: Diagnosis not present

## 2014-06-11 DIAGNOSIS — G309 Alzheimer's disease, unspecified: Secondary | ICD-10-CM | POA: Diagnosis not present

## 2014-06-11 DIAGNOSIS — F0280 Dementia in other diseases classified elsewhere without behavioral disturbance: Secondary | ICD-10-CM | POA: Diagnosis not present

## 2014-06-11 DIAGNOSIS — I10 Essential (primary) hypertension: Secondary | ICD-10-CM | POA: Diagnosis not present

## 2014-07-16 ENCOUNTER — Encounter: Payer: Self-pay | Admitting: *Deleted

## 2014-07-23 DIAGNOSIS — N184 Chronic kidney disease, stage 4 (severe): Secondary | ICD-10-CM | POA: Diagnosis not present

## 2014-07-23 DIAGNOSIS — I509 Heart failure, unspecified: Secondary | ICD-10-CM | POA: Diagnosis not present

## 2014-07-23 DIAGNOSIS — G309 Alzheimer's disease, unspecified: Secondary | ICD-10-CM | POA: Diagnosis not present

## 2014-07-23 DIAGNOSIS — R601 Generalized edema: Secondary | ICD-10-CM | POA: Diagnosis not present

## 2014-08-12 DIAGNOSIS — H3531 Nonexudative age-related macular degeneration: Secondary | ICD-10-CM | POA: Diagnosis not present

## 2014-09-01 DIAGNOSIS — N390 Urinary tract infection, site not specified: Secondary | ICD-10-CM | POA: Diagnosis not present

## 2014-09-30 DIAGNOSIS — N390 Urinary tract infection, site not specified: Secondary | ICD-10-CM | POA: Diagnosis not present

## 2014-10-20 DIAGNOSIS — M7989 Other specified soft tissue disorders: Secondary | ICD-10-CM | POA: Diagnosis not present

## 2014-10-21 DIAGNOSIS — R791 Abnormal coagulation profile: Secondary | ICD-10-CM | POA: Diagnosis not present

## 2014-10-21 DIAGNOSIS — M7989 Other specified soft tissue disorders: Secondary | ICD-10-CM | POA: Diagnosis not present

## 2014-10-21 DIAGNOSIS — I743 Embolism and thrombosis of arteries of the lower extremities: Secondary | ICD-10-CM | POA: Diagnosis not present

## 2014-10-21 DIAGNOSIS — M79604 Pain in right leg: Secondary | ICD-10-CM | POA: Diagnosis not present

## 2014-10-22 DIAGNOSIS — M25561 Pain in right knee: Secondary | ICD-10-CM | POA: Diagnosis not present

## 2014-10-22 DIAGNOSIS — I509 Heart failure, unspecified: Secondary | ICD-10-CM | POA: Diagnosis not present

## 2014-10-22 DIAGNOSIS — M6281 Muscle weakness (generalized): Secondary | ICD-10-CM | POA: Diagnosis not present

## 2014-10-22 DIAGNOSIS — F329 Major depressive disorder, single episode, unspecified: Secondary | ICD-10-CM | POA: Diagnosis not present

## 2014-10-22 DIAGNOSIS — R2681 Unsteadiness on feet: Secondary | ICD-10-CM | POA: Diagnosis not present

## 2014-10-22 DIAGNOSIS — I10 Essential (primary) hypertension: Secondary | ICD-10-CM | POA: Diagnosis not present

## 2014-10-22 DIAGNOSIS — F0390 Unspecified dementia without behavioral disturbance: Secondary | ICD-10-CM | POA: Diagnosis not present

## 2014-10-23 DIAGNOSIS — I10 Essential (primary) hypertension: Secondary | ICD-10-CM | POA: Diagnosis not present

## 2014-10-23 DIAGNOSIS — M6281 Muscle weakness (generalized): Secondary | ICD-10-CM | POA: Diagnosis not present

## 2014-10-23 DIAGNOSIS — I509 Heart failure, unspecified: Secondary | ICD-10-CM | POA: Diagnosis not present

## 2014-10-23 DIAGNOSIS — F329 Major depressive disorder, single episode, unspecified: Secondary | ICD-10-CM | POA: Diagnosis not present

## 2014-10-23 DIAGNOSIS — R2681 Unsteadiness on feet: Secondary | ICD-10-CM | POA: Diagnosis not present

## 2014-10-23 DIAGNOSIS — F0390 Unspecified dementia without behavioral disturbance: Secondary | ICD-10-CM | POA: Diagnosis not present

## 2014-10-24 DIAGNOSIS — M6281 Muscle weakness (generalized): Secondary | ICD-10-CM | POA: Diagnosis not present

## 2014-10-24 DIAGNOSIS — R2681 Unsteadiness on feet: Secondary | ICD-10-CM | POA: Diagnosis not present

## 2014-10-24 DIAGNOSIS — F329 Major depressive disorder, single episode, unspecified: Secondary | ICD-10-CM | POA: Diagnosis not present

## 2014-10-24 DIAGNOSIS — I509 Heart failure, unspecified: Secondary | ICD-10-CM | POA: Diagnosis not present

## 2014-10-24 DIAGNOSIS — I10 Essential (primary) hypertension: Secondary | ICD-10-CM | POA: Diagnosis not present

## 2014-10-24 DIAGNOSIS — F0390 Unspecified dementia without behavioral disturbance: Secondary | ICD-10-CM | POA: Diagnosis not present

## 2014-10-25 DIAGNOSIS — R2681 Unsteadiness on feet: Secondary | ICD-10-CM | POA: Diagnosis not present

## 2014-10-25 DIAGNOSIS — F329 Major depressive disorder, single episode, unspecified: Secondary | ICD-10-CM | POA: Diagnosis not present

## 2014-10-25 DIAGNOSIS — M6281 Muscle weakness (generalized): Secondary | ICD-10-CM | POA: Diagnosis not present

## 2014-10-25 DIAGNOSIS — I509 Heart failure, unspecified: Secondary | ICD-10-CM | POA: Diagnosis not present

## 2014-10-25 DIAGNOSIS — F0390 Unspecified dementia without behavioral disturbance: Secondary | ICD-10-CM | POA: Diagnosis not present

## 2014-10-25 DIAGNOSIS — I10 Essential (primary) hypertension: Secondary | ICD-10-CM | POA: Diagnosis not present

## 2014-10-28 DIAGNOSIS — M6281 Muscle weakness (generalized): Secondary | ICD-10-CM | POA: Diagnosis not present

## 2014-10-28 DIAGNOSIS — R2681 Unsteadiness on feet: Secondary | ICD-10-CM | POA: Diagnosis not present

## 2014-10-28 DIAGNOSIS — F329 Major depressive disorder, single episode, unspecified: Secondary | ICD-10-CM | POA: Diagnosis not present

## 2014-10-28 DIAGNOSIS — F0390 Unspecified dementia without behavioral disturbance: Secondary | ICD-10-CM | POA: Diagnosis not present

## 2014-10-28 DIAGNOSIS — I509 Heart failure, unspecified: Secondary | ICD-10-CM | POA: Diagnosis not present

## 2014-10-28 DIAGNOSIS — I10 Essential (primary) hypertension: Secondary | ICD-10-CM | POA: Diagnosis not present

## 2014-10-28 DIAGNOSIS — M25561 Pain in right knee: Secondary | ICD-10-CM | POA: Diagnosis not present

## 2014-10-29 DIAGNOSIS — I10 Essential (primary) hypertension: Secondary | ICD-10-CM | POA: Diagnosis not present

## 2014-10-29 DIAGNOSIS — M6281 Muscle weakness (generalized): Secondary | ICD-10-CM | POA: Diagnosis not present

## 2014-10-29 DIAGNOSIS — R2681 Unsteadiness on feet: Secondary | ICD-10-CM | POA: Diagnosis not present

## 2014-10-29 DIAGNOSIS — F329 Major depressive disorder, single episode, unspecified: Secondary | ICD-10-CM | POA: Diagnosis not present

## 2014-10-29 DIAGNOSIS — N189 Chronic kidney disease, unspecified: Secondary | ICD-10-CM | POA: Diagnosis not present

## 2014-10-29 DIAGNOSIS — I509 Heart failure, unspecified: Secondary | ICD-10-CM | POA: Diagnosis not present

## 2014-10-29 DIAGNOSIS — I129 Hypertensive chronic kidney disease with stage 1 through stage 4 chronic kidney disease, or unspecified chronic kidney disease: Secondary | ICD-10-CM | POA: Diagnosis not present

## 2014-10-29 DIAGNOSIS — F0390 Unspecified dementia without behavioral disturbance: Secondary | ICD-10-CM | POA: Diagnosis not present

## 2014-10-29 DIAGNOSIS — Z79899 Other long term (current) drug therapy: Secondary | ICD-10-CM | POA: Diagnosis not present

## 2014-10-30 DIAGNOSIS — F0390 Unspecified dementia without behavioral disturbance: Secondary | ICD-10-CM | POA: Diagnosis not present

## 2014-10-30 DIAGNOSIS — M6281 Muscle weakness (generalized): Secondary | ICD-10-CM | POA: Diagnosis not present

## 2014-10-30 DIAGNOSIS — F329 Major depressive disorder, single episode, unspecified: Secondary | ICD-10-CM | POA: Diagnosis not present

## 2014-10-30 DIAGNOSIS — I10 Essential (primary) hypertension: Secondary | ICD-10-CM | POA: Diagnosis not present

## 2014-10-30 DIAGNOSIS — I509 Heart failure, unspecified: Secondary | ICD-10-CM | POA: Diagnosis not present

## 2014-10-30 DIAGNOSIS — R2681 Unsteadiness on feet: Secondary | ICD-10-CM | POA: Diagnosis not present

## 2014-10-31 DIAGNOSIS — M6281 Muscle weakness (generalized): Secondary | ICD-10-CM | POA: Diagnosis not present

## 2014-10-31 DIAGNOSIS — I509 Heart failure, unspecified: Secondary | ICD-10-CM | POA: Diagnosis not present

## 2014-10-31 DIAGNOSIS — F329 Major depressive disorder, single episode, unspecified: Secondary | ICD-10-CM | POA: Diagnosis not present

## 2014-10-31 DIAGNOSIS — F0390 Unspecified dementia without behavioral disturbance: Secondary | ICD-10-CM | POA: Diagnosis not present

## 2014-10-31 DIAGNOSIS — R2681 Unsteadiness on feet: Secondary | ICD-10-CM | POA: Diagnosis not present

## 2014-10-31 DIAGNOSIS — I10 Essential (primary) hypertension: Secondary | ICD-10-CM | POA: Diagnosis not present

## 2014-11-01 DIAGNOSIS — F0390 Unspecified dementia without behavioral disturbance: Secondary | ICD-10-CM | POA: Diagnosis not present

## 2014-11-01 DIAGNOSIS — I10 Essential (primary) hypertension: Secondary | ICD-10-CM | POA: Diagnosis not present

## 2014-11-01 DIAGNOSIS — R2681 Unsteadiness on feet: Secondary | ICD-10-CM | POA: Diagnosis not present

## 2014-11-01 DIAGNOSIS — R601 Generalized edema: Secondary | ICD-10-CM | POA: Diagnosis not present

## 2014-11-01 DIAGNOSIS — F329 Major depressive disorder, single episode, unspecified: Secondary | ICD-10-CM | POA: Diagnosis not present

## 2014-11-01 DIAGNOSIS — R600 Localized edema: Secondary | ICD-10-CM | POA: Diagnosis not present

## 2014-11-01 DIAGNOSIS — M6281 Muscle weakness (generalized): Secondary | ICD-10-CM | POA: Diagnosis not present

## 2014-11-01 DIAGNOSIS — I509 Heart failure, unspecified: Secondary | ICD-10-CM | POA: Diagnosis not present

## 2014-11-01 DIAGNOSIS — M25571 Pain in right ankle and joints of right foot: Secondary | ICD-10-CM | POA: Diagnosis not present

## 2014-11-01 DIAGNOSIS — M25579 Pain in unspecified ankle and joints of unspecified foot: Secondary | ICD-10-CM | POA: Diagnosis not present

## 2014-11-04 DIAGNOSIS — M6281 Muscle weakness (generalized): Secondary | ICD-10-CM | POA: Diagnosis not present

## 2014-11-04 DIAGNOSIS — I10 Essential (primary) hypertension: Secondary | ICD-10-CM | POA: Diagnosis not present

## 2014-11-04 DIAGNOSIS — F0390 Unspecified dementia without behavioral disturbance: Secondary | ICD-10-CM | POA: Diagnosis not present

## 2014-11-04 DIAGNOSIS — R2681 Unsteadiness on feet: Secondary | ICD-10-CM | POA: Diagnosis not present

## 2014-11-04 DIAGNOSIS — F329 Major depressive disorder, single episode, unspecified: Secondary | ICD-10-CM | POA: Diagnosis not present

## 2014-11-04 DIAGNOSIS — I509 Heart failure, unspecified: Secondary | ICD-10-CM | POA: Diagnosis not present

## 2014-11-05 DIAGNOSIS — F0390 Unspecified dementia without behavioral disturbance: Secondary | ICD-10-CM | POA: Diagnosis not present

## 2014-11-05 DIAGNOSIS — F329 Major depressive disorder, single episode, unspecified: Secondary | ICD-10-CM | POA: Diagnosis not present

## 2014-11-05 DIAGNOSIS — G309 Alzheimer's disease, unspecified: Secondary | ICD-10-CM | POA: Diagnosis not present

## 2014-11-05 DIAGNOSIS — N184 Chronic kidney disease, stage 4 (severe): Secondary | ICD-10-CM | POA: Diagnosis not present

## 2014-11-05 DIAGNOSIS — I509 Heart failure, unspecified: Secondary | ICD-10-CM | POA: Diagnosis not present

## 2014-11-05 DIAGNOSIS — I10 Essential (primary) hypertension: Secondary | ICD-10-CM | POA: Diagnosis not present

## 2014-11-05 DIAGNOSIS — M6281 Muscle weakness (generalized): Secondary | ICD-10-CM | POA: Diagnosis not present

## 2014-11-05 DIAGNOSIS — R2681 Unsteadiness on feet: Secondary | ICD-10-CM | POA: Diagnosis not present

## 2014-11-06 DIAGNOSIS — F0390 Unspecified dementia without behavioral disturbance: Secondary | ICD-10-CM | POA: Diagnosis not present

## 2014-11-06 DIAGNOSIS — M6281 Muscle weakness (generalized): Secondary | ICD-10-CM | POA: Diagnosis not present

## 2014-11-06 DIAGNOSIS — I509 Heart failure, unspecified: Secondary | ICD-10-CM | POA: Diagnosis not present

## 2014-11-06 DIAGNOSIS — I10 Essential (primary) hypertension: Secondary | ICD-10-CM | POA: Diagnosis not present

## 2014-11-06 DIAGNOSIS — F329 Major depressive disorder, single episode, unspecified: Secondary | ICD-10-CM | POA: Diagnosis not present

## 2014-11-06 DIAGNOSIS — R2681 Unsteadiness on feet: Secondary | ICD-10-CM | POA: Diagnosis not present

## 2014-11-07 DIAGNOSIS — F329 Major depressive disorder, single episode, unspecified: Secondary | ICD-10-CM | POA: Diagnosis not present

## 2014-11-07 DIAGNOSIS — I10 Essential (primary) hypertension: Secondary | ICD-10-CM | POA: Diagnosis not present

## 2014-11-07 DIAGNOSIS — I509 Heart failure, unspecified: Secondary | ICD-10-CM | POA: Diagnosis not present

## 2014-11-07 DIAGNOSIS — R2681 Unsteadiness on feet: Secondary | ICD-10-CM | POA: Diagnosis not present

## 2014-11-07 DIAGNOSIS — F0390 Unspecified dementia without behavioral disturbance: Secondary | ICD-10-CM | POA: Diagnosis not present

## 2014-11-07 DIAGNOSIS — M6281 Muscle weakness (generalized): Secondary | ICD-10-CM | POA: Diagnosis not present

## 2014-11-08 DIAGNOSIS — I509 Heart failure, unspecified: Secondary | ICD-10-CM | POA: Diagnosis not present

## 2014-11-08 DIAGNOSIS — I10 Essential (primary) hypertension: Secondary | ICD-10-CM | POA: Diagnosis not present

## 2014-11-08 DIAGNOSIS — F0390 Unspecified dementia without behavioral disturbance: Secondary | ICD-10-CM | POA: Diagnosis not present

## 2014-11-08 DIAGNOSIS — R2681 Unsteadiness on feet: Secondary | ICD-10-CM | POA: Diagnosis not present

## 2014-11-08 DIAGNOSIS — F329 Major depressive disorder, single episode, unspecified: Secondary | ICD-10-CM | POA: Diagnosis not present

## 2014-11-08 DIAGNOSIS — M6281 Muscle weakness (generalized): Secondary | ICD-10-CM | POA: Diagnosis not present

## 2014-11-09 DIAGNOSIS — F329 Major depressive disorder, single episode, unspecified: Secondary | ICD-10-CM | POA: Diagnosis not present

## 2014-11-09 DIAGNOSIS — I509 Heart failure, unspecified: Secondary | ICD-10-CM | POA: Diagnosis not present

## 2014-11-09 DIAGNOSIS — F0390 Unspecified dementia without behavioral disturbance: Secondary | ICD-10-CM | POA: Diagnosis not present

## 2014-11-09 DIAGNOSIS — M6281 Muscle weakness (generalized): Secondary | ICD-10-CM | POA: Diagnosis not present

## 2014-11-09 DIAGNOSIS — I10 Essential (primary) hypertension: Secondary | ICD-10-CM | POA: Diagnosis not present

## 2014-11-09 DIAGNOSIS — R2681 Unsteadiness on feet: Secondary | ICD-10-CM | POA: Diagnosis not present

## 2014-11-11 DIAGNOSIS — I509 Heart failure, unspecified: Secondary | ICD-10-CM | POA: Diagnosis not present

## 2014-11-11 DIAGNOSIS — I10 Essential (primary) hypertension: Secondary | ICD-10-CM | POA: Diagnosis not present

## 2014-11-11 DIAGNOSIS — F329 Major depressive disorder, single episode, unspecified: Secondary | ICD-10-CM | POA: Diagnosis not present

## 2014-11-11 DIAGNOSIS — R2681 Unsteadiness on feet: Secondary | ICD-10-CM | POA: Diagnosis not present

## 2014-11-11 DIAGNOSIS — F0390 Unspecified dementia without behavioral disturbance: Secondary | ICD-10-CM | POA: Diagnosis not present

## 2014-11-11 DIAGNOSIS — M6281 Muscle weakness (generalized): Secondary | ICD-10-CM | POA: Diagnosis not present

## 2014-11-12 DIAGNOSIS — I509 Heart failure, unspecified: Secondary | ICD-10-CM | POA: Diagnosis not present

## 2014-11-12 DIAGNOSIS — I10 Essential (primary) hypertension: Secondary | ICD-10-CM | POA: Diagnosis not present

## 2014-11-12 DIAGNOSIS — F329 Major depressive disorder, single episode, unspecified: Secondary | ICD-10-CM | POA: Diagnosis not present

## 2014-11-12 DIAGNOSIS — F0280 Dementia in other diseases classified elsewhere without behavioral disturbance: Secondary | ICD-10-CM | POA: Diagnosis not present

## 2014-11-12 DIAGNOSIS — F321 Major depressive disorder, single episode, moderate: Secondary | ICD-10-CM | POA: Diagnosis not present

## 2014-11-12 DIAGNOSIS — M6281 Muscle weakness (generalized): Secondary | ICD-10-CM | POA: Diagnosis not present

## 2014-11-12 DIAGNOSIS — R2681 Unsteadiness on feet: Secondary | ICD-10-CM | POA: Diagnosis not present

## 2014-11-12 DIAGNOSIS — F0390 Unspecified dementia without behavioral disturbance: Secondary | ICD-10-CM | POA: Diagnosis not present

## 2014-11-13 DIAGNOSIS — F329 Major depressive disorder, single episode, unspecified: Secondary | ICD-10-CM | POA: Diagnosis not present

## 2014-11-13 DIAGNOSIS — F0390 Unspecified dementia without behavioral disturbance: Secondary | ICD-10-CM | POA: Diagnosis not present

## 2014-11-13 DIAGNOSIS — I10 Essential (primary) hypertension: Secondary | ICD-10-CM | POA: Diagnosis not present

## 2014-11-13 DIAGNOSIS — R2681 Unsteadiness on feet: Secondary | ICD-10-CM | POA: Diagnosis not present

## 2014-11-13 DIAGNOSIS — I509 Heart failure, unspecified: Secondary | ICD-10-CM | POA: Diagnosis not present

## 2014-11-13 DIAGNOSIS — M6281 Muscle weakness (generalized): Secondary | ICD-10-CM | POA: Diagnosis not present

## 2014-11-14 DIAGNOSIS — I10 Essential (primary) hypertension: Secondary | ICD-10-CM | POA: Diagnosis not present

## 2014-11-14 DIAGNOSIS — I509 Heart failure, unspecified: Secondary | ICD-10-CM | POA: Diagnosis not present

## 2014-11-14 DIAGNOSIS — F0390 Unspecified dementia without behavioral disturbance: Secondary | ICD-10-CM | POA: Diagnosis not present

## 2014-11-14 DIAGNOSIS — F329 Major depressive disorder, single episode, unspecified: Secondary | ICD-10-CM | POA: Diagnosis not present

## 2014-11-14 DIAGNOSIS — R2681 Unsteadiness on feet: Secondary | ICD-10-CM | POA: Diagnosis not present

## 2014-11-14 DIAGNOSIS — M6281 Muscle weakness (generalized): Secondary | ICD-10-CM | POA: Diagnosis not present

## 2014-11-15 DIAGNOSIS — F0390 Unspecified dementia without behavioral disturbance: Secondary | ICD-10-CM | POA: Diagnosis not present

## 2014-11-15 DIAGNOSIS — M6281 Muscle weakness (generalized): Secondary | ICD-10-CM | POA: Diagnosis not present

## 2014-11-15 DIAGNOSIS — R2681 Unsteadiness on feet: Secondary | ICD-10-CM | POA: Diagnosis not present

## 2014-11-15 DIAGNOSIS — I509 Heart failure, unspecified: Secondary | ICD-10-CM | POA: Diagnosis not present

## 2014-11-15 DIAGNOSIS — I10 Essential (primary) hypertension: Secondary | ICD-10-CM | POA: Diagnosis not present

## 2014-11-15 DIAGNOSIS — F329 Major depressive disorder, single episode, unspecified: Secondary | ICD-10-CM | POA: Diagnosis not present

## 2014-11-18 DIAGNOSIS — M6281 Muscle weakness (generalized): Secondary | ICD-10-CM | POA: Diagnosis not present

## 2014-11-18 DIAGNOSIS — R2681 Unsteadiness on feet: Secondary | ICD-10-CM | POA: Diagnosis not present

## 2014-11-18 DIAGNOSIS — I10 Essential (primary) hypertension: Secondary | ICD-10-CM | POA: Diagnosis not present

## 2014-11-18 DIAGNOSIS — F0390 Unspecified dementia without behavioral disturbance: Secondary | ICD-10-CM | POA: Diagnosis not present

## 2014-11-18 DIAGNOSIS — I509 Heart failure, unspecified: Secondary | ICD-10-CM | POA: Diagnosis not present

## 2014-11-18 DIAGNOSIS — F329 Major depressive disorder, single episode, unspecified: Secondary | ICD-10-CM | POA: Diagnosis not present

## 2014-11-19 DIAGNOSIS — M6281 Muscle weakness (generalized): Secondary | ICD-10-CM | POA: Diagnosis not present

## 2014-11-19 DIAGNOSIS — I509 Heart failure, unspecified: Secondary | ICD-10-CM | POA: Diagnosis not present

## 2014-11-19 DIAGNOSIS — F0390 Unspecified dementia without behavioral disturbance: Secondary | ICD-10-CM | POA: Diagnosis not present

## 2014-11-19 DIAGNOSIS — I10 Essential (primary) hypertension: Secondary | ICD-10-CM | POA: Diagnosis not present

## 2014-11-19 DIAGNOSIS — R2681 Unsteadiness on feet: Secondary | ICD-10-CM | POA: Diagnosis not present

## 2014-11-19 DIAGNOSIS — F329 Major depressive disorder, single episode, unspecified: Secondary | ICD-10-CM | POA: Diagnosis not present

## 2014-11-20 DIAGNOSIS — R2681 Unsteadiness on feet: Secondary | ICD-10-CM | POA: Diagnosis not present

## 2014-11-20 DIAGNOSIS — I509 Heart failure, unspecified: Secondary | ICD-10-CM | POA: Diagnosis not present

## 2014-11-20 DIAGNOSIS — M6281 Muscle weakness (generalized): Secondary | ICD-10-CM | POA: Diagnosis not present

## 2014-11-20 DIAGNOSIS — F0390 Unspecified dementia without behavioral disturbance: Secondary | ICD-10-CM | POA: Diagnosis not present

## 2014-11-20 DIAGNOSIS — F329 Major depressive disorder, single episode, unspecified: Secondary | ICD-10-CM | POA: Diagnosis not present

## 2014-11-20 DIAGNOSIS — I10 Essential (primary) hypertension: Secondary | ICD-10-CM | POA: Diagnosis not present

## 2014-11-21 DIAGNOSIS — F329 Major depressive disorder, single episode, unspecified: Secondary | ICD-10-CM | POA: Diagnosis not present

## 2014-11-21 DIAGNOSIS — F0390 Unspecified dementia without behavioral disturbance: Secondary | ICD-10-CM | POA: Diagnosis not present

## 2014-11-21 DIAGNOSIS — I10 Essential (primary) hypertension: Secondary | ICD-10-CM | POA: Diagnosis not present

## 2014-11-21 DIAGNOSIS — M6281 Muscle weakness (generalized): Secondary | ICD-10-CM | POA: Diagnosis not present

## 2014-11-21 DIAGNOSIS — I509 Heart failure, unspecified: Secondary | ICD-10-CM | POA: Diagnosis not present

## 2014-11-21 DIAGNOSIS — R2681 Unsteadiness on feet: Secondary | ICD-10-CM | POA: Diagnosis not present

## 2014-11-22 DIAGNOSIS — I10 Essential (primary) hypertension: Secondary | ICD-10-CM | POA: Diagnosis not present

## 2014-11-22 DIAGNOSIS — M6281 Muscle weakness (generalized): Secondary | ICD-10-CM | POA: Diagnosis not present

## 2014-11-22 DIAGNOSIS — I509 Heart failure, unspecified: Secondary | ICD-10-CM | POA: Diagnosis not present

## 2014-11-22 DIAGNOSIS — F0390 Unspecified dementia without behavioral disturbance: Secondary | ICD-10-CM | POA: Diagnosis not present

## 2014-11-22 DIAGNOSIS — R2681 Unsteadiness on feet: Secondary | ICD-10-CM | POA: Diagnosis not present

## 2014-11-22 DIAGNOSIS — F329 Major depressive disorder, single episode, unspecified: Secondary | ICD-10-CM | POA: Diagnosis not present

## 2014-11-25 DIAGNOSIS — I509 Heart failure, unspecified: Secondary | ICD-10-CM | POA: Diagnosis not present

## 2014-11-25 DIAGNOSIS — R2681 Unsteadiness on feet: Secondary | ICD-10-CM | POA: Diagnosis not present

## 2014-11-25 DIAGNOSIS — F329 Major depressive disorder, single episode, unspecified: Secondary | ICD-10-CM | POA: Diagnosis not present

## 2014-11-25 DIAGNOSIS — I10 Essential (primary) hypertension: Secondary | ICD-10-CM | POA: Diagnosis not present

## 2014-11-25 DIAGNOSIS — F0390 Unspecified dementia without behavioral disturbance: Secondary | ICD-10-CM | POA: Diagnosis not present

## 2014-11-25 DIAGNOSIS — M6281 Muscle weakness (generalized): Secondary | ICD-10-CM | POA: Diagnosis not present

## 2014-11-26 DIAGNOSIS — F0390 Unspecified dementia without behavioral disturbance: Secondary | ICD-10-CM | POA: Diagnosis not present

## 2014-11-26 DIAGNOSIS — I509 Heart failure, unspecified: Secondary | ICD-10-CM | POA: Diagnosis not present

## 2014-11-26 DIAGNOSIS — I69998 Other sequelae following unspecified cerebrovascular disease: Secondary | ICD-10-CM | POA: Diagnosis not present

## 2014-11-26 DIAGNOSIS — M25561 Pain in right knee: Secondary | ICD-10-CM | POA: Diagnosis not present

## 2014-11-26 DIAGNOSIS — I10 Essential (primary) hypertension: Secondary | ICD-10-CM | POA: Diagnosis not present

## 2014-11-26 DIAGNOSIS — M6281 Muscle weakness (generalized): Secondary | ICD-10-CM | POA: Diagnosis not present

## 2014-11-26 DIAGNOSIS — F329 Major depressive disorder, single episode, unspecified: Secondary | ICD-10-CM | POA: Diagnosis not present

## 2014-11-26 DIAGNOSIS — R2681 Unsteadiness on feet: Secondary | ICD-10-CM | POA: Diagnosis not present

## 2014-11-27 DIAGNOSIS — I10 Essential (primary) hypertension: Secondary | ICD-10-CM | POA: Diagnosis not present

## 2014-11-27 DIAGNOSIS — F329 Major depressive disorder, single episode, unspecified: Secondary | ICD-10-CM | POA: Diagnosis not present

## 2014-11-27 DIAGNOSIS — I509 Heart failure, unspecified: Secondary | ICD-10-CM | POA: Diagnosis not present

## 2014-11-27 DIAGNOSIS — M6281 Muscle weakness (generalized): Secondary | ICD-10-CM | POA: Diagnosis not present

## 2014-11-27 DIAGNOSIS — F0390 Unspecified dementia without behavioral disturbance: Secondary | ICD-10-CM | POA: Diagnosis not present

## 2014-11-27 DIAGNOSIS — R2681 Unsteadiness on feet: Secondary | ICD-10-CM | POA: Diagnosis not present

## 2014-11-28 DIAGNOSIS — I10 Essential (primary) hypertension: Secondary | ICD-10-CM | POA: Diagnosis not present

## 2014-11-28 DIAGNOSIS — I509 Heart failure, unspecified: Secondary | ICD-10-CM | POA: Diagnosis not present

## 2014-11-28 DIAGNOSIS — F329 Major depressive disorder, single episode, unspecified: Secondary | ICD-10-CM | POA: Diagnosis not present

## 2014-11-28 DIAGNOSIS — R2681 Unsteadiness on feet: Secondary | ICD-10-CM | POA: Diagnosis not present

## 2014-11-28 DIAGNOSIS — M6281 Muscle weakness (generalized): Secondary | ICD-10-CM | POA: Diagnosis not present

## 2014-11-28 DIAGNOSIS — F0390 Unspecified dementia without behavioral disturbance: Secondary | ICD-10-CM | POA: Diagnosis not present

## 2014-11-28 DIAGNOSIS — R531 Weakness: Secondary | ICD-10-CM | POA: Diagnosis not present

## 2014-11-29 DIAGNOSIS — R2681 Unsteadiness on feet: Secondary | ICD-10-CM | POA: Diagnosis not present

## 2014-11-29 DIAGNOSIS — M6281 Muscle weakness (generalized): Secondary | ICD-10-CM | POA: Diagnosis not present

## 2014-11-29 DIAGNOSIS — I509 Heart failure, unspecified: Secondary | ICD-10-CM | POA: Diagnosis not present

## 2014-11-29 DIAGNOSIS — I10 Essential (primary) hypertension: Secondary | ICD-10-CM | POA: Diagnosis not present

## 2014-11-29 DIAGNOSIS — F329 Major depressive disorder, single episode, unspecified: Secondary | ICD-10-CM | POA: Diagnosis not present

## 2014-11-29 DIAGNOSIS — F0390 Unspecified dementia without behavioral disturbance: Secondary | ICD-10-CM | POA: Diagnosis not present

## 2014-12-28 DIAGNOSIS — K219 Gastro-esophageal reflux disease without esophagitis: Secondary | ICD-10-CM | POA: Diagnosis not present

## 2014-12-28 DIAGNOSIS — I509 Heart failure, unspecified: Secondary | ICD-10-CM | POA: Diagnosis not present

## 2014-12-28 DIAGNOSIS — N184 Chronic kidney disease, stage 4 (severe): Secondary | ICD-10-CM | POA: Diagnosis not present

## 2014-12-28 DIAGNOSIS — G309 Alzheimer's disease, unspecified: Secondary | ICD-10-CM | POA: Diagnosis not present

## 2014-12-31 DIAGNOSIS — F321 Major depressive disorder, single episode, moderate: Secondary | ICD-10-CM | POA: Diagnosis not present

## 2014-12-31 DIAGNOSIS — N390 Urinary tract infection, site not specified: Secondary | ICD-10-CM | POA: Diagnosis not present

## 2014-12-31 DIAGNOSIS — F0280 Dementia in other diseases classified elsewhere without behavioral disturbance: Secondary | ICD-10-CM | POA: Diagnosis not present

## 2015-01-01 DIAGNOSIS — I509 Heart failure, unspecified: Secondary | ICD-10-CM | POA: Diagnosis not present

## 2015-01-01 DIAGNOSIS — N189 Chronic kidney disease, unspecified: Secondary | ICD-10-CM | POA: Diagnosis not present

## 2015-01-01 DIAGNOSIS — D649 Anemia, unspecified: Secondary | ICD-10-CM | POA: Diagnosis not present

## 2015-01-06 DIAGNOSIS — I87323 Chronic venous hypertension (idiopathic) with inflammation of bilateral lower extremity: Secondary | ICD-10-CM | POA: Diagnosis not present

## 2015-01-06 DIAGNOSIS — M1711 Unilateral primary osteoarthritis, right knee: Secondary | ICD-10-CM | POA: Diagnosis not present

## 2015-01-06 DIAGNOSIS — M1009 Idiopathic gout, multiple sites: Secondary | ICD-10-CM | POA: Diagnosis not present

## 2015-02-01 DIAGNOSIS — N189 Chronic kidney disease, unspecified: Secondary | ICD-10-CM | POA: Diagnosis not present

## 2015-02-01 DIAGNOSIS — F0390 Unspecified dementia without behavioral disturbance: Secondary | ICD-10-CM | POA: Diagnosis not present

## 2015-02-01 DIAGNOSIS — I10 Essential (primary) hypertension: Secondary | ICD-10-CM | POA: Diagnosis not present

## 2015-02-01 DIAGNOSIS — I509 Heart failure, unspecified: Secondary | ICD-10-CM | POA: Diagnosis not present

## 2015-02-03 DIAGNOSIS — H34821 Venous engorgement, right eye: Secondary | ICD-10-CM | POA: Diagnosis not present

## 2015-02-03 DIAGNOSIS — I87323 Chronic venous hypertension (idiopathic) with inflammation of bilateral lower extremity: Secondary | ICD-10-CM | POA: Diagnosis not present

## 2015-02-03 DIAGNOSIS — M1711 Unilateral primary osteoarthritis, right knee: Secondary | ICD-10-CM | POA: Diagnosis not present

## 2015-02-03 DIAGNOSIS — M1009 Idiopathic gout, multiple sites: Secondary | ICD-10-CM | POA: Diagnosis not present

## 2015-02-11 DIAGNOSIS — W1849XA Other slipping, tripping and stumbling without falling, initial encounter: Secondary | ICD-10-CM | POA: Diagnosis not present

## 2015-02-11 DIAGNOSIS — M25552 Pain in left hip: Secondary | ICD-10-CM | POA: Diagnosis not present

## 2015-03-08 DIAGNOSIS — M6281 Muscle weakness (generalized): Secondary | ICD-10-CM | POA: Diagnosis not present

## 2015-03-08 DIAGNOSIS — F0390 Unspecified dementia without behavioral disturbance: Secondary | ICD-10-CM | POA: Diagnosis not present

## 2015-03-08 DIAGNOSIS — I2510 Atherosclerotic heart disease of native coronary artery without angina pectoris: Secondary | ICD-10-CM | POA: Diagnosis not present

## 2015-03-31 DIAGNOSIS — F0280 Dementia in other diseases classified elsewhere without behavioral disturbance: Secondary | ICD-10-CM | POA: Diagnosis not present

## 2015-03-31 DIAGNOSIS — F321 Major depressive disorder, single episode, moderate: Secondary | ICD-10-CM | POA: Diagnosis not present

## 2015-04-02 DIAGNOSIS — F0390 Unspecified dementia without behavioral disturbance: Secondary | ICD-10-CM | POA: Diagnosis not present

## 2015-04-02 DIAGNOSIS — Z1389 Encounter for screening for other disorder: Secondary | ICD-10-CM | POA: Diagnosis not present

## 2015-04-02 DIAGNOSIS — Z139 Encounter for screening, unspecified: Secondary | ICD-10-CM | POA: Diagnosis not present

## 2015-04-02 DIAGNOSIS — Z1211 Encounter for screening for malignant neoplasm of colon: Secondary | ICD-10-CM | POA: Diagnosis not present

## 2015-04-02 DIAGNOSIS — I509 Heart failure, unspecified: Secondary | ICD-10-CM | POA: Diagnosis not present

## 2015-04-02 DIAGNOSIS — Z0000 Encounter for general adult medical examination without abnormal findings: Secondary | ICD-10-CM | POA: Diagnosis not present

## 2015-04-02 DIAGNOSIS — Z6835 Body mass index (BMI) 35.0-35.9, adult: Secondary | ICD-10-CM | POA: Diagnosis not present

## 2015-04-02 DIAGNOSIS — I10 Essential (primary) hypertension: Secondary | ICD-10-CM | POA: Diagnosis not present

## 2015-04-02 DIAGNOSIS — N189 Chronic kidney disease, unspecified: Secondary | ICD-10-CM | POA: Diagnosis not present

## 2015-05-17 DIAGNOSIS — F329 Major depressive disorder, single episode, unspecified: Secondary | ICD-10-CM | POA: Diagnosis not present

## 2015-05-17 DIAGNOSIS — F0390 Unspecified dementia without behavioral disturbance: Secondary | ICD-10-CM | POA: Diagnosis not present

## 2015-05-17 DIAGNOSIS — I509 Heart failure, unspecified: Secondary | ICD-10-CM | POA: Diagnosis not present

## 2015-05-17 DIAGNOSIS — N189 Chronic kidney disease, unspecified: Secondary | ICD-10-CM | POA: Diagnosis not present

## 2015-06-19 DIAGNOSIS — M6281 Muscle weakness (generalized): Secondary | ICD-10-CM | POA: Diagnosis not present

## 2015-06-19 DIAGNOSIS — I509 Heart failure, unspecified: Secondary | ICD-10-CM | POA: Diagnosis not present

## 2015-06-19 DIAGNOSIS — I10 Essential (primary) hypertension: Secondary | ICD-10-CM | POA: Diagnosis not present

## 2015-06-19 DIAGNOSIS — R2681 Unsteadiness on feet: Secondary | ICD-10-CM | POA: Diagnosis not present

## 2015-06-19 DIAGNOSIS — M25561 Pain in right knee: Secondary | ICD-10-CM | POA: Diagnosis not present

## 2015-06-19 DIAGNOSIS — F0390 Unspecified dementia without behavioral disturbance: Secondary | ICD-10-CM | POA: Diagnosis not present

## 2015-06-19 DIAGNOSIS — I69998 Other sequelae following unspecified cerebrovascular disease: Secondary | ICD-10-CM | POA: Diagnosis not present

## 2015-06-19 DIAGNOSIS — R2689 Other abnormalities of gait and mobility: Secondary | ICD-10-CM | POA: Diagnosis not present

## 2015-06-19 DIAGNOSIS — F329 Major depressive disorder, single episode, unspecified: Secondary | ICD-10-CM | POA: Diagnosis not present

## 2015-06-20 DIAGNOSIS — G300 Alzheimer's disease with early onset: Secondary | ICD-10-CM | POA: Diagnosis not present

## 2015-06-20 DIAGNOSIS — M6281 Muscle weakness (generalized): Secondary | ICD-10-CM | POA: Diagnosis not present

## 2015-06-20 DIAGNOSIS — F321 Major depressive disorder, single episode, moderate: Secondary | ICD-10-CM | POA: Diagnosis not present

## 2015-06-20 DIAGNOSIS — F0280 Dementia in other diseases classified elsewhere without behavioral disturbance: Secondary | ICD-10-CM | POA: Diagnosis not present

## 2015-06-20 DIAGNOSIS — N189 Chronic kidney disease, unspecified: Secondary | ICD-10-CM | POA: Diagnosis not present

## 2015-06-20 DIAGNOSIS — I10 Essential (primary) hypertension: Secondary | ICD-10-CM | POA: Diagnosis not present

## 2015-06-20 DIAGNOSIS — F329 Major depressive disorder, single episode, unspecified: Secondary | ICD-10-CM | POA: Diagnosis not present

## 2015-06-20 DIAGNOSIS — F0390 Unspecified dementia without behavioral disturbance: Secondary | ICD-10-CM | POA: Diagnosis not present

## 2015-06-20 DIAGNOSIS — R2681 Unsteadiness on feet: Secondary | ICD-10-CM | POA: Diagnosis not present

## 2015-06-20 DIAGNOSIS — E119 Type 2 diabetes mellitus without complications: Secondary | ICD-10-CM | POA: Diagnosis not present

## 2015-06-20 DIAGNOSIS — I509 Heart failure, unspecified: Secondary | ICD-10-CM | POA: Diagnosis not present

## 2015-06-21 DIAGNOSIS — I509 Heart failure, unspecified: Secondary | ICD-10-CM | POA: Diagnosis not present

## 2015-06-21 DIAGNOSIS — F0390 Unspecified dementia without behavioral disturbance: Secondary | ICD-10-CM | POA: Diagnosis not present

## 2015-06-21 DIAGNOSIS — F329 Major depressive disorder, single episode, unspecified: Secondary | ICD-10-CM | POA: Diagnosis not present

## 2015-06-21 DIAGNOSIS — N189 Chronic kidney disease, unspecified: Secondary | ICD-10-CM | POA: Diagnosis not present

## 2015-06-23 DIAGNOSIS — F0390 Unspecified dementia without behavioral disturbance: Secondary | ICD-10-CM | POA: Diagnosis not present

## 2015-06-23 DIAGNOSIS — F329 Major depressive disorder, single episode, unspecified: Secondary | ICD-10-CM | POA: Diagnosis not present

## 2015-06-23 DIAGNOSIS — M6281 Muscle weakness (generalized): Secondary | ICD-10-CM | POA: Diagnosis not present

## 2015-06-23 DIAGNOSIS — I10 Essential (primary) hypertension: Secondary | ICD-10-CM | POA: Diagnosis not present

## 2015-06-23 DIAGNOSIS — I509 Heart failure, unspecified: Secondary | ICD-10-CM | POA: Diagnosis not present

## 2015-06-23 DIAGNOSIS — R2681 Unsteadiness on feet: Secondary | ICD-10-CM | POA: Diagnosis not present

## 2015-06-24 DIAGNOSIS — I10 Essential (primary) hypertension: Secondary | ICD-10-CM | POA: Diagnosis not present

## 2015-06-24 DIAGNOSIS — R2681 Unsteadiness on feet: Secondary | ICD-10-CM | POA: Diagnosis not present

## 2015-06-24 DIAGNOSIS — I509 Heart failure, unspecified: Secondary | ICD-10-CM | POA: Diagnosis not present

## 2015-06-24 DIAGNOSIS — M6281 Muscle weakness (generalized): Secondary | ICD-10-CM | POA: Diagnosis not present

## 2015-06-24 DIAGNOSIS — F329 Major depressive disorder, single episode, unspecified: Secondary | ICD-10-CM | POA: Diagnosis not present

## 2015-06-24 DIAGNOSIS — F0390 Unspecified dementia without behavioral disturbance: Secondary | ICD-10-CM | POA: Diagnosis not present

## 2015-06-25 DIAGNOSIS — R2681 Unsteadiness on feet: Secondary | ICD-10-CM | POA: Diagnosis not present

## 2015-06-25 DIAGNOSIS — I509 Heart failure, unspecified: Secondary | ICD-10-CM | POA: Diagnosis not present

## 2015-06-25 DIAGNOSIS — F0390 Unspecified dementia without behavioral disturbance: Secondary | ICD-10-CM | POA: Diagnosis not present

## 2015-06-25 DIAGNOSIS — I10 Essential (primary) hypertension: Secondary | ICD-10-CM | POA: Diagnosis not present

## 2015-06-25 DIAGNOSIS — F329 Major depressive disorder, single episode, unspecified: Secondary | ICD-10-CM | POA: Diagnosis not present

## 2015-06-25 DIAGNOSIS — M6281 Muscle weakness (generalized): Secondary | ICD-10-CM | POA: Diagnosis not present

## 2015-06-26 DIAGNOSIS — I10 Essential (primary) hypertension: Secondary | ICD-10-CM | POA: Diagnosis not present

## 2015-06-26 DIAGNOSIS — R2681 Unsteadiness on feet: Secondary | ICD-10-CM | POA: Diagnosis not present

## 2015-06-26 DIAGNOSIS — M6281 Muscle weakness (generalized): Secondary | ICD-10-CM | POA: Diagnosis not present

## 2015-06-26 DIAGNOSIS — F329 Major depressive disorder, single episode, unspecified: Secondary | ICD-10-CM | POA: Diagnosis not present

## 2015-06-26 DIAGNOSIS — F0390 Unspecified dementia without behavioral disturbance: Secondary | ICD-10-CM | POA: Diagnosis not present

## 2015-06-26 DIAGNOSIS — I509 Heart failure, unspecified: Secondary | ICD-10-CM | POA: Diagnosis not present

## 2015-06-27 DIAGNOSIS — F0390 Unspecified dementia without behavioral disturbance: Secondary | ICD-10-CM | POA: Diagnosis not present

## 2015-06-27 DIAGNOSIS — F329 Major depressive disorder, single episode, unspecified: Secondary | ICD-10-CM | POA: Diagnosis not present

## 2015-06-27 DIAGNOSIS — I509 Heart failure, unspecified: Secondary | ICD-10-CM | POA: Diagnosis not present

## 2015-06-27 DIAGNOSIS — R2681 Unsteadiness on feet: Secondary | ICD-10-CM | POA: Diagnosis not present

## 2015-06-27 DIAGNOSIS — M6281 Muscle weakness (generalized): Secondary | ICD-10-CM | POA: Diagnosis not present

## 2015-06-27 DIAGNOSIS — I10 Essential (primary) hypertension: Secondary | ICD-10-CM | POA: Diagnosis not present

## 2015-06-30 DIAGNOSIS — R2681 Unsteadiness on feet: Secondary | ICD-10-CM | POA: Diagnosis not present

## 2015-06-30 DIAGNOSIS — F329 Major depressive disorder, single episode, unspecified: Secondary | ICD-10-CM | POA: Diagnosis not present

## 2015-06-30 DIAGNOSIS — I69998 Other sequelae following unspecified cerebrovascular disease: Secondary | ICD-10-CM | POA: Diagnosis not present

## 2015-06-30 DIAGNOSIS — M25561 Pain in right knee: Secondary | ICD-10-CM | POA: Diagnosis not present

## 2015-06-30 DIAGNOSIS — I509 Heart failure, unspecified: Secondary | ICD-10-CM | POA: Diagnosis not present

## 2015-06-30 DIAGNOSIS — F0390 Unspecified dementia without behavioral disturbance: Secondary | ICD-10-CM | POA: Diagnosis not present

## 2015-06-30 DIAGNOSIS — M6281 Muscle weakness (generalized): Secondary | ICD-10-CM | POA: Diagnosis not present

## 2015-06-30 DIAGNOSIS — I10 Essential (primary) hypertension: Secondary | ICD-10-CM | POA: Diagnosis not present

## 2015-06-30 DIAGNOSIS — R2689 Other abnormalities of gait and mobility: Secondary | ICD-10-CM | POA: Diagnosis not present

## 2015-07-01 DIAGNOSIS — F0390 Unspecified dementia without behavioral disturbance: Secondary | ICD-10-CM | POA: Diagnosis not present

## 2015-07-01 DIAGNOSIS — F329 Major depressive disorder, single episode, unspecified: Secondary | ICD-10-CM | POA: Diagnosis not present

## 2015-07-01 DIAGNOSIS — I509 Heart failure, unspecified: Secondary | ICD-10-CM | POA: Diagnosis not present

## 2015-07-01 DIAGNOSIS — M6281 Muscle weakness (generalized): Secondary | ICD-10-CM | POA: Diagnosis not present

## 2015-07-01 DIAGNOSIS — R2681 Unsteadiness on feet: Secondary | ICD-10-CM | POA: Diagnosis not present

## 2015-07-01 DIAGNOSIS — I10 Essential (primary) hypertension: Secondary | ICD-10-CM | POA: Diagnosis not present

## 2015-07-02 DIAGNOSIS — F0390 Unspecified dementia without behavioral disturbance: Secondary | ICD-10-CM | POA: Diagnosis not present

## 2015-07-02 DIAGNOSIS — M6281 Muscle weakness (generalized): Secondary | ICD-10-CM | POA: Diagnosis not present

## 2015-07-02 DIAGNOSIS — I10 Essential (primary) hypertension: Secondary | ICD-10-CM | POA: Diagnosis not present

## 2015-07-02 DIAGNOSIS — F329 Major depressive disorder, single episode, unspecified: Secondary | ICD-10-CM | POA: Diagnosis not present

## 2015-07-02 DIAGNOSIS — I509 Heart failure, unspecified: Secondary | ICD-10-CM | POA: Diagnosis not present

## 2015-07-02 DIAGNOSIS — R2681 Unsteadiness on feet: Secondary | ICD-10-CM | POA: Diagnosis not present

## 2015-07-03 DIAGNOSIS — I509 Heart failure, unspecified: Secondary | ICD-10-CM | POA: Diagnosis not present

## 2015-07-03 DIAGNOSIS — I10 Essential (primary) hypertension: Secondary | ICD-10-CM | POA: Diagnosis not present

## 2015-07-03 DIAGNOSIS — M6281 Muscle weakness (generalized): Secondary | ICD-10-CM | POA: Diagnosis not present

## 2015-07-03 DIAGNOSIS — F329 Major depressive disorder, single episode, unspecified: Secondary | ICD-10-CM | POA: Diagnosis not present

## 2015-07-03 DIAGNOSIS — R2681 Unsteadiness on feet: Secondary | ICD-10-CM | POA: Diagnosis not present

## 2015-07-03 DIAGNOSIS — F0390 Unspecified dementia without behavioral disturbance: Secondary | ICD-10-CM | POA: Diagnosis not present

## 2015-07-06 DIAGNOSIS — I509 Heart failure, unspecified: Secondary | ICD-10-CM | POA: Diagnosis not present

## 2015-07-06 DIAGNOSIS — F0390 Unspecified dementia without behavioral disturbance: Secondary | ICD-10-CM | POA: Diagnosis not present

## 2015-07-06 DIAGNOSIS — F329 Major depressive disorder, single episode, unspecified: Secondary | ICD-10-CM | POA: Diagnosis not present

## 2015-07-06 DIAGNOSIS — I10 Essential (primary) hypertension: Secondary | ICD-10-CM | POA: Diagnosis not present

## 2015-07-06 DIAGNOSIS — M6281 Muscle weakness (generalized): Secondary | ICD-10-CM | POA: Diagnosis not present

## 2015-07-06 DIAGNOSIS — R2681 Unsteadiness on feet: Secondary | ICD-10-CM | POA: Diagnosis not present

## 2015-07-07 DIAGNOSIS — R2681 Unsteadiness on feet: Secondary | ICD-10-CM | POA: Diagnosis not present

## 2015-07-07 DIAGNOSIS — F0390 Unspecified dementia without behavioral disturbance: Secondary | ICD-10-CM | POA: Diagnosis not present

## 2015-07-07 DIAGNOSIS — M6281 Muscle weakness (generalized): Secondary | ICD-10-CM | POA: Diagnosis not present

## 2015-07-07 DIAGNOSIS — F329 Major depressive disorder, single episode, unspecified: Secondary | ICD-10-CM | POA: Diagnosis not present

## 2015-07-07 DIAGNOSIS — I509 Heart failure, unspecified: Secondary | ICD-10-CM | POA: Diagnosis not present

## 2015-07-07 DIAGNOSIS — I10 Essential (primary) hypertension: Secondary | ICD-10-CM | POA: Diagnosis not present

## 2015-07-08 DIAGNOSIS — M6281 Muscle weakness (generalized): Secondary | ICD-10-CM | POA: Diagnosis not present

## 2015-07-08 DIAGNOSIS — F0390 Unspecified dementia without behavioral disturbance: Secondary | ICD-10-CM | POA: Diagnosis not present

## 2015-07-08 DIAGNOSIS — F329 Major depressive disorder, single episode, unspecified: Secondary | ICD-10-CM | POA: Diagnosis not present

## 2015-07-08 DIAGNOSIS — I10 Essential (primary) hypertension: Secondary | ICD-10-CM | POA: Diagnosis not present

## 2015-07-08 DIAGNOSIS — I509 Heart failure, unspecified: Secondary | ICD-10-CM | POA: Diagnosis not present

## 2015-07-08 DIAGNOSIS — R2681 Unsteadiness on feet: Secondary | ICD-10-CM | POA: Diagnosis not present

## 2015-07-09 DIAGNOSIS — I10 Essential (primary) hypertension: Secondary | ICD-10-CM | POA: Diagnosis not present

## 2015-07-09 DIAGNOSIS — M6281 Muscle weakness (generalized): Secondary | ICD-10-CM | POA: Diagnosis not present

## 2015-07-09 DIAGNOSIS — R2681 Unsteadiness on feet: Secondary | ICD-10-CM | POA: Diagnosis not present

## 2015-07-09 DIAGNOSIS — I509 Heart failure, unspecified: Secondary | ICD-10-CM | POA: Diagnosis not present

## 2015-07-09 DIAGNOSIS — F0390 Unspecified dementia without behavioral disturbance: Secondary | ICD-10-CM | POA: Diagnosis not present

## 2015-07-09 DIAGNOSIS — F329 Major depressive disorder, single episode, unspecified: Secondary | ICD-10-CM | POA: Diagnosis not present

## 2015-07-10 DIAGNOSIS — M6281 Muscle weakness (generalized): Secondary | ICD-10-CM | POA: Diagnosis not present

## 2015-07-10 DIAGNOSIS — R2681 Unsteadiness on feet: Secondary | ICD-10-CM | POA: Diagnosis not present

## 2015-07-10 DIAGNOSIS — F0390 Unspecified dementia without behavioral disturbance: Secondary | ICD-10-CM | POA: Diagnosis not present

## 2015-07-10 DIAGNOSIS — I509 Heart failure, unspecified: Secondary | ICD-10-CM | POA: Diagnosis not present

## 2015-07-10 DIAGNOSIS — F329 Major depressive disorder, single episode, unspecified: Secondary | ICD-10-CM | POA: Diagnosis not present

## 2015-07-10 DIAGNOSIS — I10 Essential (primary) hypertension: Secondary | ICD-10-CM | POA: Diagnosis not present

## 2015-07-11 DIAGNOSIS — M6281 Muscle weakness (generalized): Secondary | ICD-10-CM | POA: Diagnosis not present

## 2015-07-11 DIAGNOSIS — F0390 Unspecified dementia without behavioral disturbance: Secondary | ICD-10-CM | POA: Diagnosis not present

## 2015-07-11 DIAGNOSIS — R2681 Unsteadiness on feet: Secondary | ICD-10-CM | POA: Diagnosis not present

## 2015-07-11 DIAGNOSIS — I10 Essential (primary) hypertension: Secondary | ICD-10-CM | POA: Diagnosis not present

## 2015-07-11 DIAGNOSIS — F329 Major depressive disorder, single episode, unspecified: Secondary | ICD-10-CM | POA: Diagnosis not present

## 2015-07-11 DIAGNOSIS — I509 Heart failure, unspecified: Secondary | ICD-10-CM | POA: Diagnosis not present

## 2015-07-14 DIAGNOSIS — I509 Heart failure, unspecified: Secondary | ICD-10-CM | POA: Diagnosis not present

## 2015-07-14 DIAGNOSIS — M6281 Muscle weakness (generalized): Secondary | ICD-10-CM | POA: Diagnosis not present

## 2015-07-14 DIAGNOSIS — I10 Essential (primary) hypertension: Secondary | ICD-10-CM | POA: Diagnosis not present

## 2015-07-14 DIAGNOSIS — F329 Major depressive disorder, single episode, unspecified: Secondary | ICD-10-CM | POA: Diagnosis not present

## 2015-07-14 DIAGNOSIS — F0390 Unspecified dementia without behavioral disturbance: Secondary | ICD-10-CM | POA: Diagnosis not present

## 2015-07-14 DIAGNOSIS — R2681 Unsteadiness on feet: Secondary | ICD-10-CM | POA: Diagnosis not present

## 2015-07-15 DIAGNOSIS — I509 Heart failure, unspecified: Secondary | ICD-10-CM | POA: Diagnosis not present

## 2015-07-15 DIAGNOSIS — F329 Major depressive disorder, single episode, unspecified: Secondary | ICD-10-CM | POA: Diagnosis not present

## 2015-07-15 DIAGNOSIS — I10 Essential (primary) hypertension: Secondary | ICD-10-CM | POA: Diagnosis not present

## 2015-07-15 DIAGNOSIS — F0390 Unspecified dementia without behavioral disturbance: Secondary | ICD-10-CM | POA: Diagnosis not present

## 2015-07-15 DIAGNOSIS — M6281 Muscle weakness (generalized): Secondary | ICD-10-CM | POA: Diagnosis not present

## 2015-07-15 DIAGNOSIS — R2681 Unsteadiness on feet: Secondary | ICD-10-CM | POA: Diagnosis not present

## 2015-07-16 DIAGNOSIS — I10 Essential (primary) hypertension: Secondary | ICD-10-CM | POA: Diagnosis not present

## 2015-07-16 DIAGNOSIS — F0390 Unspecified dementia without behavioral disturbance: Secondary | ICD-10-CM | POA: Diagnosis not present

## 2015-07-16 DIAGNOSIS — R2681 Unsteadiness on feet: Secondary | ICD-10-CM | POA: Diagnosis not present

## 2015-07-16 DIAGNOSIS — I509 Heart failure, unspecified: Secondary | ICD-10-CM | POA: Diagnosis not present

## 2015-07-16 DIAGNOSIS — F329 Major depressive disorder, single episode, unspecified: Secondary | ICD-10-CM | POA: Diagnosis not present

## 2015-07-16 DIAGNOSIS — M6281 Muscle weakness (generalized): Secondary | ICD-10-CM | POA: Diagnosis not present

## 2015-07-26 DIAGNOSIS — F0390 Unspecified dementia without behavioral disturbance: Secondary | ICD-10-CM | POA: Diagnosis not present

## 2015-07-26 DIAGNOSIS — I509 Heart failure, unspecified: Secondary | ICD-10-CM | POA: Diagnosis not present

## 2015-07-26 DIAGNOSIS — F329 Major depressive disorder, single episode, unspecified: Secondary | ICD-10-CM | POA: Diagnosis not present

## 2015-07-26 DIAGNOSIS — N189 Chronic kidney disease, unspecified: Secondary | ICD-10-CM | POA: Diagnosis not present

## 2015-08-07 DIAGNOSIS — F320 Major depressive disorder, single episode, mild: Secondary | ICD-10-CM | POA: Diagnosis not present

## 2015-08-07 DIAGNOSIS — G3184 Mild cognitive impairment, so stated: Secondary | ICD-10-CM | POA: Diagnosis not present

## 2015-09-03 DIAGNOSIS — H353132 Nonexudative age-related macular degeneration, bilateral, intermediate dry stage: Secondary | ICD-10-CM | POA: Diagnosis not present

## 2015-09-03 DIAGNOSIS — I509 Heart failure, unspecified: Secondary | ICD-10-CM | POA: Diagnosis not present

## 2015-09-03 DIAGNOSIS — M1A9XX0 Chronic gout, unspecified, without tophus (tophi): Secondary | ICD-10-CM | POA: Diagnosis not present

## 2015-09-03 DIAGNOSIS — F0390 Unspecified dementia without behavioral disturbance: Secondary | ICD-10-CM | POA: Diagnosis not present

## 2015-09-03 DIAGNOSIS — N189 Chronic kidney disease, unspecified: Secondary | ICD-10-CM | POA: Diagnosis not present

## 2015-09-06 DIAGNOSIS — F331 Major depressive disorder, recurrent, moderate: Secondary | ICD-10-CM | POA: Diagnosis not present

## 2015-09-06 DIAGNOSIS — I509 Heart failure, unspecified: Secondary | ICD-10-CM | POA: Diagnosis not present

## 2015-09-06 DIAGNOSIS — N189 Chronic kidney disease, unspecified: Secondary | ICD-10-CM | POA: Diagnosis not present

## 2015-09-06 DIAGNOSIS — F0390 Unspecified dementia without behavioral disturbance: Secondary | ICD-10-CM | POA: Diagnosis not present

## 2015-10-14 DIAGNOSIS — F0280 Dementia in other diseases classified elsewhere without behavioral disturbance: Secondary | ICD-10-CM | POA: Diagnosis not present

## 2015-10-14 DIAGNOSIS — F321 Major depressive disorder, single episode, moderate: Secondary | ICD-10-CM | POA: Diagnosis not present

## 2015-11-05 DIAGNOSIS — L820 Inflamed seborrheic keratosis: Secondary | ICD-10-CM | POA: Diagnosis not present

## 2015-11-05 DIAGNOSIS — L821 Other seborrheic keratosis: Secondary | ICD-10-CM | POA: Diagnosis not present

## 2015-11-05 DIAGNOSIS — L570 Actinic keratosis: Secondary | ICD-10-CM | POA: Diagnosis not present

## 2015-11-05 DIAGNOSIS — B079 Viral wart, unspecified: Secondary | ICD-10-CM | POA: Diagnosis not present

## 2015-11-12 DIAGNOSIS — L03211 Cellulitis of face: Secondary | ICD-10-CM | POA: Diagnosis not present

## 2015-11-13 DIAGNOSIS — I509 Heart failure, unspecified: Secondary | ICD-10-CM | POA: Diagnosis not present

## 2015-11-13 DIAGNOSIS — I10 Essential (primary) hypertension: Secondary | ICD-10-CM | POA: Diagnosis not present

## 2015-11-13 DIAGNOSIS — M6281 Muscle weakness (generalized): Secondary | ICD-10-CM | POA: Diagnosis not present

## 2015-11-13 DIAGNOSIS — I69998 Other sequelae following unspecified cerebrovascular disease: Secondary | ICD-10-CM | POA: Diagnosis not present

## 2015-11-13 DIAGNOSIS — F329 Major depressive disorder, single episode, unspecified: Secondary | ICD-10-CM | POA: Diagnosis not present

## 2015-11-13 DIAGNOSIS — R293 Abnormal posture: Secondary | ICD-10-CM | POA: Diagnosis not present

## 2015-11-13 DIAGNOSIS — M25561 Pain in right knee: Secondary | ICD-10-CM | POA: Diagnosis not present

## 2015-11-13 DIAGNOSIS — R2681 Unsteadiness on feet: Secondary | ICD-10-CM | POA: Diagnosis not present

## 2015-11-13 DIAGNOSIS — R2689 Other abnormalities of gait and mobility: Secondary | ICD-10-CM | POA: Diagnosis not present

## 2015-11-13 DIAGNOSIS — F0390 Unspecified dementia without behavioral disturbance: Secondary | ICD-10-CM | POA: Diagnosis not present

## 2015-11-14 DIAGNOSIS — F0390 Unspecified dementia without behavioral disturbance: Secondary | ICD-10-CM | POA: Diagnosis not present

## 2015-11-14 DIAGNOSIS — I509 Heart failure, unspecified: Secondary | ICD-10-CM | POA: Diagnosis not present

## 2015-11-14 DIAGNOSIS — M6281 Muscle weakness (generalized): Secondary | ICD-10-CM | POA: Diagnosis not present

## 2015-11-14 DIAGNOSIS — I10 Essential (primary) hypertension: Secondary | ICD-10-CM | POA: Diagnosis not present

## 2015-11-14 DIAGNOSIS — R2681 Unsteadiness on feet: Secondary | ICD-10-CM | POA: Diagnosis not present

## 2015-11-14 DIAGNOSIS — F329 Major depressive disorder, single episode, unspecified: Secondary | ICD-10-CM | POA: Diagnosis not present

## 2015-11-17 DIAGNOSIS — R2681 Unsteadiness on feet: Secondary | ICD-10-CM | POA: Diagnosis not present

## 2015-11-17 DIAGNOSIS — I10 Essential (primary) hypertension: Secondary | ICD-10-CM | POA: Diagnosis not present

## 2015-11-17 DIAGNOSIS — F0390 Unspecified dementia without behavioral disturbance: Secondary | ICD-10-CM | POA: Diagnosis not present

## 2015-11-17 DIAGNOSIS — M6281 Muscle weakness (generalized): Secondary | ICD-10-CM | POA: Diagnosis not present

## 2015-11-17 DIAGNOSIS — I509 Heart failure, unspecified: Secondary | ICD-10-CM | POA: Diagnosis not present

## 2015-11-17 DIAGNOSIS — F329 Major depressive disorder, single episode, unspecified: Secondary | ICD-10-CM | POA: Diagnosis not present

## 2015-11-18 DIAGNOSIS — F0390 Unspecified dementia without behavioral disturbance: Secondary | ICD-10-CM | POA: Diagnosis not present

## 2015-11-18 DIAGNOSIS — I10 Essential (primary) hypertension: Secondary | ICD-10-CM | POA: Diagnosis not present

## 2015-11-18 DIAGNOSIS — M6281 Muscle weakness (generalized): Secondary | ICD-10-CM | POA: Diagnosis not present

## 2015-11-18 DIAGNOSIS — R2681 Unsteadiness on feet: Secondary | ICD-10-CM | POA: Diagnosis not present

## 2015-11-18 DIAGNOSIS — F329 Major depressive disorder, single episode, unspecified: Secondary | ICD-10-CM | POA: Diagnosis not present

## 2015-11-18 DIAGNOSIS — I509 Heart failure, unspecified: Secondary | ICD-10-CM | POA: Diagnosis not present

## 2015-11-19 DIAGNOSIS — I10 Essential (primary) hypertension: Secondary | ICD-10-CM | POA: Diagnosis not present

## 2015-11-19 DIAGNOSIS — F329 Major depressive disorder, single episode, unspecified: Secondary | ICD-10-CM | POA: Diagnosis not present

## 2015-11-19 DIAGNOSIS — F0390 Unspecified dementia without behavioral disturbance: Secondary | ICD-10-CM | POA: Diagnosis not present

## 2015-11-19 DIAGNOSIS — R2681 Unsteadiness on feet: Secondary | ICD-10-CM | POA: Diagnosis not present

## 2015-11-19 DIAGNOSIS — I509 Heart failure, unspecified: Secondary | ICD-10-CM | POA: Diagnosis not present

## 2015-11-19 DIAGNOSIS — M6281 Muscle weakness (generalized): Secondary | ICD-10-CM | POA: Diagnosis not present

## 2015-11-20 DIAGNOSIS — F0390 Unspecified dementia without behavioral disturbance: Secondary | ICD-10-CM | POA: Diagnosis not present

## 2015-11-20 DIAGNOSIS — R2681 Unsteadiness on feet: Secondary | ICD-10-CM | POA: Diagnosis not present

## 2015-11-20 DIAGNOSIS — F329 Major depressive disorder, single episode, unspecified: Secondary | ICD-10-CM | POA: Diagnosis not present

## 2015-11-20 DIAGNOSIS — I509 Heart failure, unspecified: Secondary | ICD-10-CM | POA: Diagnosis not present

## 2015-11-20 DIAGNOSIS — M6281 Muscle weakness (generalized): Secondary | ICD-10-CM | POA: Diagnosis not present

## 2015-11-20 DIAGNOSIS — I10 Essential (primary) hypertension: Secondary | ICD-10-CM | POA: Diagnosis not present

## 2015-11-21 DIAGNOSIS — L03211 Cellulitis of face: Secondary | ICD-10-CM | POA: Diagnosis not present

## 2015-11-21 DIAGNOSIS — R2681 Unsteadiness on feet: Secondary | ICD-10-CM | POA: Diagnosis not present

## 2015-11-21 DIAGNOSIS — I10 Essential (primary) hypertension: Secondary | ICD-10-CM | POA: Diagnosis not present

## 2015-11-21 DIAGNOSIS — F0390 Unspecified dementia without behavioral disturbance: Secondary | ICD-10-CM | POA: Diagnosis not present

## 2015-11-21 DIAGNOSIS — I509 Heart failure, unspecified: Secondary | ICD-10-CM | POA: Diagnosis not present

## 2015-11-21 DIAGNOSIS — M6281 Muscle weakness (generalized): Secondary | ICD-10-CM | POA: Diagnosis not present

## 2015-11-21 DIAGNOSIS — F329 Major depressive disorder, single episode, unspecified: Secondary | ICD-10-CM | POA: Diagnosis not present

## 2015-11-24 DIAGNOSIS — F329 Major depressive disorder, single episode, unspecified: Secondary | ICD-10-CM | POA: Diagnosis not present

## 2015-11-24 DIAGNOSIS — R2681 Unsteadiness on feet: Secondary | ICD-10-CM | POA: Diagnosis not present

## 2015-11-24 DIAGNOSIS — I10 Essential (primary) hypertension: Secondary | ICD-10-CM | POA: Diagnosis not present

## 2015-11-24 DIAGNOSIS — M6281 Muscle weakness (generalized): Secondary | ICD-10-CM | POA: Diagnosis not present

## 2015-11-24 DIAGNOSIS — I509 Heart failure, unspecified: Secondary | ICD-10-CM | POA: Diagnosis not present

## 2015-11-24 DIAGNOSIS — F0390 Unspecified dementia without behavioral disturbance: Secondary | ICD-10-CM | POA: Diagnosis not present

## 2015-11-25 DIAGNOSIS — F329 Major depressive disorder, single episode, unspecified: Secondary | ICD-10-CM | POA: Diagnosis not present

## 2015-11-25 DIAGNOSIS — R2681 Unsteadiness on feet: Secondary | ICD-10-CM | POA: Diagnosis not present

## 2015-11-25 DIAGNOSIS — M6281 Muscle weakness (generalized): Secondary | ICD-10-CM | POA: Diagnosis not present

## 2015-11-25 DIAGNOSIS — I10 Essential (primary) hypertension: Secondary | ICD-10-CM | POA: Diagnosis not present

## 2015-11-25 DIAGNOSIS — I509 Heart failure, unspecified: Secondary | ICD-10-CM | POA: Diagnosis not present

## 2015-11-25 DIAGNOSIS — F0390 Unspecified dementia without behavioral disturbance: Secondary | ICD-10-CM | POA: Diagnosis not present

## 2015-11-26 DIAGNOSIS — M25561 Pain in right knee: Secondary | ICD-10-CM | POA: Diagnosis not present

## 2015-11-26 DIAGNOSIS — R293 Abnormal posture: Secondary | ICD-10-CM | POA: Diagnosis not present

## 2015-11-26 DIAGNOSIS — I10 Essential (primary) hypertension: Secondary | ICD-10-CM | POA: Diagnosis not present

## 2015-11-26 DIAGNOSIS — R2681 Unsteadiness on feet: Secondary | ICD-10-CM | POA: Diagnosis not present

## 2015-11-26 DIAGNOSIS — M6281 Muscle weakness (generalized): Secondary | ICD-10-CM | POA: Diagnosis not present

## 2015-11-26 DIAGNOSIS — F0390 Unspecified dementia without behavioral disturbance: Secondary | ICD-10-CM | POA: Diagnosis not present

## 2015-11-26 DIAGNOSIS — I509 Heart failure, unspecified: Secondary | ICD-10-CM | POA: Diagnosis not present

## 2015-11-26 DIAGNOSIS — I69998 Other sequelae following unspecified cerebrovascular disease: Secondary | ICD-10-CM | POA: Diagnosis not present

## 2015-11-26 DIAGNOSIS — R2689 Other abnormalities of gait and mobility: Secondary | ICD-10-CM | POA: Diagnosis not present

## 2015-11-26 DIAGNOSIS — F329 Major depressive disorder, single episode, unspecified: Secondary | ICD-10-CM | POA: Diagnosis not present

## 2015-12-12 DIAGNOSIS — I509 Heart failure, unspecified: Secondary | ICD-10-CM | POA: Diagnosis not present

## 2015-12-12 DIAGNOSIS — F0390 Unspecified dementia without behavioral disturbance: Secondary | ICD-10-CM | POA: Diagnosis not present

## 2015-12-12 DIAGNOSIS — F331 Major depressive disorder, recurrent, moderate: Secondary | ICD-10-CM | POA: Diagnosis not present

## 2015-12-12 DIAGNOSIS — N189 Chronic kidney disease, unspecified: Secondary | ICD-10-CM | POA: Diagnosis not present

## 2015-12-23 DIAGNOSIS — F0280 Dementia in other diseases classified elsewhere without behavioral disturbance: Secondary | ICD-10-CM | POA: Diagnosis not present

## 2015-12-23 DIAGNOSIS — F321 Major depressive disorder, single episode, moderate: Secondary | ICD-10-CM | POA: Diagnosis not present

## 2016-02-17 DIAGNOSIS — F0280 Dementia in other diseases classified elsewhere without behavioral disturbance: Secondary | ICD-10-CM | POA: Diagnosis not present

## 2016-02-17 DIAGNOSIS — G309 Alzheimer's disease, unspecified: Secondary | ICD-10-CM | POA: Diagnosis not present

## 2016-02-17 DIAGNOSIS — F321 Major depressive disorder, single episode, moderate: Secondary | ICD-10-CM | POA: Diagnosis not present

## 2016-02-21 DIAGNOSIS — W19XXXA Unspecified fall, initial encounter: Secondary | ICD-10-CM | POA: Diagnosis not present

## 2016-02-21 DIAGNOSIS — M25561 Pain in right knee: Secondary | ICD-10-CM | POA: Diagnosis not present

## 2016-02-21 DIAGNOSIS — M25562 Pain in left knee: Secondary | ICD-10-CM | POA: Diagnosis not present

## 2016-02-27 DIAGNOSIS — R4182 Altered mental status, unspecified: Secondary | ICD-10-CM | POA: Diagnosis not present

## 2016-02-27 DIAGNOSIS — R300 Dysuria: Secondary | ICD-10-CM | POA: Diagnosis not present

## 2016-03-01 DIAGNOSIS — R293 Abnormal posture: Secondary | ICD-10-CM | POA: Diagnosis not present

## 2016-03-01 DIAGNOSIS — I69998 Other sequelae following unspecified cerebrovascular disease: Secondary | ICD-10-CM | POA: Diagnosis not present

## 2016-03-01 DIAGNOSIS — M6281 Muscle weakness (generalized): Secondary | ICD-10-CM | POA: Diagnosis not present

## 2016-03-01 DIAGNOSIS — I10 Essential (primary) hypertension: Secondary | ICD-10-CM | POA: Diagnosis not present

## 2016-03-01 DIAGNOSIS — M25562 Pain in left knee: Secondary | ICD-10-CM | POA: Diagnosis not present

## 2016-03-01 DIAGNOSIS — M25561 Pain in right knee: Secondary | ICD-10-CM | POA: Diagnosis not present

## 2016-03-01 DIAGNOSIS — R42 Dizziness and giddiness: Secondary | ICD-10-CM | POA: Diagnosis not present

## 2016-03-01 DIAGNOSIS — R2689 Other abnormalities of gait and mobility: Secondary | ICD-10-CM | POA: Diagnosis not present

## 2016-03-01 DIAGNOSIS — F329 Major depressive disorder, single episode, unspecified: Secondary | ICD-10-CM | POA: Diagnosis not present

## 2016-03-01 DIAGNOSIS — G629 Polyneuropathy, unspecified: Secondary | ICD-10-CM | POA: Diagnosis not present

## 2016-03-01 DIAGNOSIS — F0390 Unspecified dementia without behavioral disturbance: Secondary | ICD-10-CM | POA: Diagnosis not present

## 2016-03-01 DIAGNOSIS — I509 Heart failure, unspecified: Secondary | ICD-10-CM | POA: Diagnosis not present

## 2016-03-02 DIAGNOSIS — M25562 Pain in left knee: Secondary | ICD-10-CM | POA: Diagnosis not present

## 2016-03-02 DIAGNOSIS — I10 Essential (primary) hypertension: Secondary | ICD-10-CM | POA: Diagnosis not present

## 2016-03-02 DIAGNOSIS — I509 Heart failure, unspecified: Secondary | ICD-10-CM | POA: Diagnosis not present

## 2016-03-02 DIAGNOSIS — F329 Major depressive disorder, single episode, unspecified: Secondary | ICD-10-CM | POA: Diagnosis not present

## 2016-03-02 DIAGNOSIS — F0390 Unspecified dementia without behavioral disturbance: Secondary | ICD-10-CM | POA: Diagnosis not present

## 2016-03-02 DIAGNOSIS — M6281 Muscle weakness (generalized): Secondary | ICD-10-CM | POA: Diagnosis not present

## 2016-03-03 DIAGNOSIS — F329 Major depressive disorder, single episode, unspecified: Secondary | ICD-10-CM | POA: Diagnosis not present

## 2016-03-03 DIAGNOSIS — F0390 Unspecified dementia without behavioral disturbance: Secondary | ICD-10-CM | POA: Diagnosis not present

## 2016-03-03 DIAGNOSIS — I10 Essential (primary) hypertension: Secondary | ICD-10-CM | POA: Diagnosis not present

## 2016-03-03 DIAGNOSIS — M6281 Muscle weakness (generalized): Secondary | ICD-10-CM | POA: Diagnosis not present

## 2016-03-03 DIAGNOSIS — I509 Heart failure, unspecified: Secondary | ICD-10-CM | POA: Diagnosis not present

## 2016-03-03 DIAGNOSIS — M25562 Pain in left knee: Secondary | ICD-10-CM | POA: Diagnosis not present

## 2016-03-04 DIAGNOSIS — F0390 Unspecified dementia without behavioral disturbance: Secondary | ICD-10-CM | POA: Diagnosis not present

## 2016-03-04 DIAGNOSIS — F329 Major depressive disorder, single episode, unspecified: Secondary | ICD-10-CM | POA: Diagnosis not present

## 2016-03-04 DIAGNOSIS — M25562 Pain in left knee: Secondary | ICD-10-CM | POA: Diagnosis not present

## 2016-03-04 DIAGNOSIS — I10 Essential (primary) hypertension: Secondary | ICD-10-CM | POA: Diagnosis not present

## 2016-03-04 DIAGNOSIS — M6281 Muscle weakness (generalized): Secondary | ICD-10-CM | POA: Diagnosis not present

## 2016-03-04 DIAGNOSIS — I509 Heart failure, unspecified: Secondary | ICD-10-CM | POA: Diagnosis not present

## 2016-03-05 DIAGNOSIS — M25562 Pain in left knee: Secondary | ICD-10-CM | POA: Diagnosis not present

## 2016-03-05 DIAGNOSIS — F0390 Unspecified dementia without behavioral disturbance: Secondary | ICD-10-CM | POA: Diagnosis not present

## 2016-03-05 DIAGNOSIS — I10 Essential (primary) hypertension: Secondary | ICD-10-CM | POA: Diagnosis not present

## 2016-03-05 DIAGNOSIS — F329 Major depressive disorder, single episode, unspecified: Secondary | ICD-10-CM | POA: Diagnosis not present

## 2016-03-05 DIAGNOSIS — I509 Heart failure, unspecified: Secondary | ICD-10-CM | POA: Diagnosis not present

## 2016-03-05 DIAGNOSIS — M6281 Muscle weakness (generalized): Secondary | ICD-10-CM | POA: Diagnosis not present

## 2016-03-08 DIAGNOSIS — I10 Essential (primary) hypertension: Secondary | ICD-10-CM | POA: Diagnosis not present

## 2016-03-08 DIAGNOSIS — I509 Heart failure, unspecified: Secondary | ICD-10-CM | POA: Diagnosis not present

## 2016-03-08 DIAGNOSIS — M25562 Pain in left knee: Secondary | ICD-10-CM | POA: Diagnosis not present

## 2016-03-08 DIAGNOSIS — F329 Major depressive disorder, single episode, unspecified: Secondary | ICD-10-CM | POA: Diagnosis not present

## 2016-03-08 DIAGNOSIS — F0390 Unspecified dementia without behavioral disturbance: Secondary | ICD-10-CM | POA: Diagnosis not present

## 2016-03-08 DIAGNOSIS — M6281 Muscle weakness (generalized): Secondary | ICD-10-CM | POA: Diagnosis not present

## 2016-03-09 DIAGNOSIS — I509 Heart failure, unspecified: Secondary | ICD-10-CM | POA: Diagnosis not present

## 2016-03-09 DIAGNOSIS — M25562 Pain in left knee: Secondary | ICD-10-CM | POA: Diagnosis not present

## 2016-03-09 DIAGNOSIS — M6281 Muscle weakness (generalized): Secondary | ICD-10-CM | POA: Diagnosis not present

## 2016-03-09 DIAGNOSIS — F329 Major depressive disorder, single episode, unspecified: Secondary | ICD-10-CM | POA: Diagnosis not present

## 2016-03-09 DIAGNOSIS — I10 Essential (primary) hypertension: Secondary | ICD-10-CM | POA: Diagnosis not present

## 2016-03-09 DIAGNOSIS — F0390 Unspecified dementia without behavioral disturbance: Secondary | ICD-10-CM | POA: Diagnosis not present

## 2016-03-10 DIAGNOSIS — I10 Essential (primary) hypertension: Secondary | ICD-10-CM | POA: Diagnosis not present

## 2016-03-10 DIAGNOSIS — I509 Heart failure, unspecified: Secondary | ICD-10-CM | POA: Diagnosis not present

## 2016-03-10 DIAGNOSIS — F0390 Unspecified dementia without behavioral disturbance: Secondary | ICD-10-CM | POA: Diagnosis not present

## 2016-03-10 DIAGNOSIS — M6281 Muscle weakness (generalized): Secondary | ICD-10-CM | POA: Diagnosis not present

## 2016-03-10 DIAGNOSIS — F329 Major depressive disorder, single episode, unspecified: Secondary | ICD-10-CM | POA: Diagnosis not present

## 2016-03-10 DIAGNOSIS — M25562 Pain in left knee: Secondary | ICD-10-CM | POA: Diagnosis not present

## 2016-03-11 DIAGNOSIS — M25562 Pain in left knee: Secondary | ICD-10-CM | POA: Diagnosis not present

## 2016-03-11 DIAGNOSIS — M6281 Muscle weakness (generalized): Secondary | ICD-10-CM | POA: Diagnosis not present

## 2016-03-11 DIAGNOSIS — H353133 Nonexudative age-related macular degeneration, bilateral, advanced atrophic without subfoveal involvement: Secondary | ICD-10-CM | POA: Diagnosis not present

## 2016-03-11 DIAGNOSIS — I509 Heart failure, unspecified: Secondary | ICD-10-CM | POA: Diagnosis not present

## 2016-03-11 DIAGNOSIS — F329 Major depressive disorder, single episode, unspecified: Secondary | ICD-10-CM | POA: Diagnosis not present

## 2016-03-11 DIAGNOSIS — F0390 Unspecified dementia without behavioral disturbance: Secondary | ICD-10-CM | POA: Diagnosis not present

## 2016-03-11 DIAGNOSIS — I10 Essential (primary) hypertension: Secondary | ICD-10-CM | POA: Diagnosis not present

## 2016-03-12 DIAGNOSIS — I10 Essential (primary) hypertension: Secondary | ICD-10-CM | POA: Diagnosis not present

## 2016-03-12 DIAGNOSIS — F329 Major depressive disorder, single episode, unspecified: Secondary | ICD-10-CM | POA: Diagnosis not present

## 2016-03-12 DIAGNOSIS — F0390 Unspecified dementia without behavioral disturbance: Secondary | ICD-10-CM | POA: Diagnosis not present

## 2016-03-12 DIAGNOSIS — M6281 Muscle weakness (generalized): Secondary | ICD-10-CM | POA: Diagnosis not present

## 2016-03-12 DIAGNOSIS — I509 Heart failure, unspecified: Secondary | ICD-10-CM | POA: Diagnosis not present

## 2016-03-12 DIAGNOSIS — M25562 Pain in left knee: Secondary | ICD-10-CM | POA: Diagnosis not present

## 2016-03-14 DIAGNOSIS — D512 Transcobalamin II deficiency: Secondary | ICD-10-CM | POA: Diagnosis not present

## 2016-03-14 DIAGNOSIS — R4182 Altered mental status, unspecified: Secondary | ICD-10-CM | POA: Diagnosis not present

## 2016-03-15 DIAGNOSIS — M25562 Pain in left knee: Secondary | ICD-10-CM | POA: Diagnosis not present

## 2016-03-15 DIAGNOSIS — I10 Essential (primary) hypertension: Secondary | ICD-10-CM | POA: Diagnosis not present

## 2016-03-15 DIAGNOSIS — I509 Heart failure, unspecified: Secondary | ICD-10-CM | POA: Diagnosis not present

## 2016-03-15 DIAGNOSIS — F0390 Unspecified dementia without behavioral disturbance: Secondary | ICD-10-CM | POA: Diagnosis not present

## 2016-03-15 DIAGNOSIS — F329 Major depressive disorder, single episode, unspecified: Secondary | ICD-10-CM | POA: Diagnosis not present

## 2016-03-15 DIAGNOSIS — M6281 Muscle weakness (generalized): Secondary | ICD-10-CM | POA: Diagnosis not present

## 2016-03-16 DIAGNOSIS — F329 Major depressive disorder, single episode, unspecified: Secondary | ICD-10-CM | POA: Diagnosis not present

## 2016-03-16 DIAGNOSIS — M6281 Muscle weakness (generalized): Secondary | ICD-10-CM | POA: Diagnosis not present

## 2016-03-16 DIAGNOSIS — M25562 Pain in left knee: Secondary | ICD-10-CM | POA: Diagnosis not present

## 2016-03-16 DIAGNOSIS — I509 Heart failure, unspecified: Secondary | ICD-10-CM | POA: Diagnosis not present

## 2016-03-16 DIAGNOSIS — F0390 Unspecified dementia without behavioral disturbance: Secondary | ICD-10-CM | POA: Diagnosis not present

## 2016-03-16 DIAGNOSIS — I10 Essential (primary) hypertension: Secondary | ICD-10-CM | POA: Diagnosis not present

## 2016-03-17 DIAGNOSIS — M6281 Muscle weakness (generalized): Secondary | ICD-10-CM | POA: Diagnosis not present

## 2016-03-17 DIAGNOSIS — F0390 Unspecified dementia without behavioral disturbance: Secondary | ICD-10-CM | POA: Diagnosis not present

## 2016-03-17 DIAGNOSIS — F329 Major depressive disorder, single episode, unspecified: Secondary | ICD-10-CM | POA: Diagnosis not present

## 2016-03-17 DIAGNOSIS — I10 Essential (primary) hypertension: Secondary | ICD-10-CM | POA: Diagnosis not present

## 2016-03-17 DIAGNOSIS — I509 Heart failure, unspecified: Secondary | ICD-10-CM | POA: Diagnosis not present

## 2016-03-17 DIAGNOSIS — M25562 Pain in left knee: Secondary | ICD-10-CM | POA: Diagnosis not present

## 2016-03-18 DIAGNOSIS — F329 Major depressive disorder, single episode, unspecified: Secondary | ICD-10-CM | POA: Diagnosis not present

## 2016-03-18 DIAGNOSIS — M25562 Pain in left knee: Secondary | ICD-10-CM | POA: Diagnosis not present

## 2016-03-18 DIAGNOSIS — I10 Essential (primary) hypertension: Secondary | ICD-10-CM | POA: Diagnosis not present

## 2016-03-18 DIAGNOSIS — M6281 Muscle weakness (generalized): Secondary | ICD-10-CM | POA: Diagnosis not present

## 2016-03-18 DIAGNOSIS — I509 Heart failure, unspecified: Secondary | ICD-10-CM | POA: Diagnosis not present

## 2016-03-18 DIAGNOSIS — F0390 Unspecified dementia without behavioral disturbance: Secondary | ICD-10-CM | POA: Diagnosis not present

## 2016-03-19 DIAGNOSIS — I509 Heart failure, unspecified: Secondary | ICD-10-CM | POA: Diagnosis not present

## 2016-03-19 DIAGNOSIS — F329 Major depressive disorder, single episode, unspecified: Secondary | ICD-10-CM | POA: Diagnosis not present

## 2016-03-19 DIAGNOSIS — M25562 Pain in left knee: Secondary | ICD-10-CM | POA: Diagnosis not present

## 2016-03-19 DIAGNOSIS — M6281 Muscle weakness (generalized): Secondary | ICD-10-CM | POA: Diagnosis not present

## 2016-03-19 DIAGNOSIS — F0390 Unspecified dementia without behavioral disturbance: Secondary | ICD-10-CM | POA: Diagnosis not present

## 2016-03-19 DIAGNOSIS — I10 Essential (primary) hypertension: Secondary | ICD-10-CM | POA: Diagnosis not present

## 2016-03-22 DIAGNOSIS — F0390 Unspecified dementia without behavioral disturbance: Secondary | ICD-10-CM | POA: Diagnosis not present

## 2016-03-22 DIAGNOSIS — M6281 Muscle weakness (generalized): Secondary | ICD-10-CM | POA: Diagnosis not present

## 2016-03-22 DIAGNOSIS — I509 Heart failure, unspecified: Secondary | ICD-10-CM | POA: Diagnosis not present

## 2016-03-22 DIAGNOSIS — F329 Major depressive disorder, single episode, unspecified: Secondary | ICD-10-CM | POA: Diagnosis not present

## 2016-03-22 DIAGNOSIS — M25562 Pain in left knee: Secondary | ICD-10-CM | POA: Diagnosis not present

## 2016-03-22 DIAGNOSIS — I10 Essential (primary) hypertension: Secondary | ICD-10-CM | POA: Diagnosis not present

## 2016-03-23 DIAGNOSIS — M6281 Muscle weakness (generalized): Secondary | ICD-10-CM | POA: Diagnosis not present

## 2016-03-23 DIAGNOSIS — I10 Essential (primary) hypertension: Secondary | ICD-10-CM | POA: Diagnosis not present

## 2016-03-23 DIAGNOSIS — I509 Heart failure, unspecified: Secondary | ICD-10-CM | POA: Diagnosis not present

## 2016-03-23 DIAGNOSIS — F329 Major depressive disorder, single episode, unspecified: Secondary | ICD-10-CM | POA: Diagnosis not present

## 2016-03-23 DIAGNOSIS — F0390 Unspecified dementia without behavioral disturbance: Secondary | ICD-10-CM | POA: Diagnosis not present

## 2016-03-23 DIAGNOSIS — M25562 Pain in left knee: Secondary | ICD-10-CM | POA: Diagnosis not present

## 2016-03-24 DIAGNOSIS — I10 Essential (primary) hypertension: Secondary | ICD-10-CM | POA: Diagnosis not present

## 2016-03-24 DIAGNOSIS — M6281 Muscle weakness (generalized): Secondary | ICD-10-CM | POA: Diagnosis not present

## 2016-03-24 DIAGNOSIS — F329 Major depressive disorder, single episode, unspecified: Secondary | ICD-10-CM | POA: Diagnosis not present

## 2016-03-24 DIAGNOSIS — I509 Heart failure, unspecified: Secondary | ICD-10-CM | POA: Diagnosis not present

## 2016-03-24 DIAGNOSIS — F0390 Unspecified dementia without behavioral disturbance: Secondary | ICD-10-CM | POA: Diagnosis not present

## 2016-03-24 DIAGNOSIS — M25562 Pain in left knee: Secondary | ICD-10-CM | POA: Diagnosis not present

## 2016-03-25 DIAGNOSIS — F0390 Unspecified dementia without behavioral disturbance: Secondary | ICD-10-CM | POA: Diagnosis not present

## 2016-03-25 DIAGNOSIS — M6281 Muscle weakness (generalized): Secondary | ICD-10-CM | POA: Diagnosis not present

## 2016-03-25 DIAGNOSIS — I10 Essential (primary) hypertension: Secondary | ICD-10-CM | POA: Diagnosis not present

## 2016-03-25 DIAGNOSIS — F329 Major depressive disorder, single episode, unspecified: Secondary | ICD-10-CM | POA: Diagnosis not present

## 2016-03-25 DIAGNOSIS — I509 Heart failure, unspecified: Secondary | ICD-10-CM | POA: Diagnosis not present

## 2016-03-25 DIAGNOSIS — M25562 Pain in left knee: Secondary | ICD-10-CM | POA: Diagnosis not present

## 2016-03-26 DIAGNOSIS — I509 Heart failure, unspecified: Secondary | ICD-10-CM | POA: Diagnosis not present

## 2016-03-26 DIAGNOSIS — M25562 Pain in left knee: Secondary | ICD-10-CM | POA: Diagnosis not present

## 2016-03-26 DIAGNOSIS — F329 Major depressive disorder, single episode, unspecified: Secondary | ICD-10-CM | POA: Diagnosis not present

## 2016-03-26 DIAGNOSIS — F0390 Unspecified dementia without behavioral disturbance: Secondary | ICD-10-CM | POA: Diagnosis not present

## 2016-03-26 DIAGNOSIS — M6281 Muscle weakness (generalized): Secondary | ICD-10-CM | POA: Diagnosis not present

## 2016-03-26 DIAGNOSIS — I10 Essential (primary) hypertension: Secondary | ICD-10-CM | POA: Diagnosis not present

## 2016-03-29 DIAGNOSIS — R42 Dizziness and giddiness: Secondary | ICD-10-CM | POA: Diagnosis not present

## 2016-03-29 DIAGNOSIS — G629 Polyneuropathy, unspecified: Secondary | ICD-10-CM | POA: Diagnosis not present

## 2016-03-29 DIAGNOSIS — I10 Essential (primary) hypertension: Secondary | ICD-10-CM | POA: Diagnosis not present

## 2016-03-29 DIAGNOSIS — F0390 Unspecified dementia without behavioral disturbance: Secondary | ICD-10-CM | POA: Diagnosis not present

## 2016-03-29 DIAGNOSIS — M6281 Muscle weakness (generalized): Secondary | ICD-10-CM | POA: Diagnosis not present

## 2016-03-29 DIAGNOSIS — M25562 Pain in left knee: Secondary | ICD-10-CM | POA: Diagnosis not present

## 2016-03-29 DIAGNOSIS — I69998 Other sequelae following unspecified cerebrovascular disease: Secondary | ICD-10-CM | POA: Diagnosis not present

## 2016-03-29 DIAGNOSIS — M25561 Pain in right knee: Secondary | ICD-10-CM | POA: Diagnosis not present

## 2016-03-29 DIAGNOSIS — I509 Heart failure, unspecified: Secondary | ICD-10-CM | POA: Diagnosis not present

## 2016-03-29 DIAGNOSIS — F329 Major depressive disorder, single episode, unspecified: Secondary | ICD-10-CM | POA: Diagnosis not present

## 2016-03-29 DIAGNOSIS — R2689 Other abnormalities of gait and mobility: Secondary | ICD-10-CM | POA: Diagnosis not present

## 2016-03-29 DIAGNOSIS — R293 Abnormal posture: Secondary | ICD-10-CM | POA: Diagnosis not present

## 2016-03-30 DIAGNOSIS — F0390 Unspecified dementia without behavioral disturbance: Secondary | ICD-10-CM | POA: Diagnosis not present

## 2016-03-30 DIAGNOSIS — I509 Heart failure, unspecified: Secondary | ICD-10-CM | POA: Diagnosis not present

## 2016-03-30 DIAGNOSIS — I10 Essential (primary) hypertension: Secondary | ICD-10-CM | POA: Diagnosis not present

## 2016-03-30 DIAGNOSIS — M6281 Muscle weakness (generalized): Secondary | ICD-10-CM | POA: Diagnosis not present

## 2016-03-30 DIAGNOSIS — F329 Major depressive disorder, single episode, unspecified: Secondary | ICD-10-CM | POA: Diagnosis not present

## 2016-03-30 DIAGNOSIS — M25562 Pain in left knee: Secondary | ICD-10-CM | POA: Diagnosis not present

## 2016-03-31 DIAGNOSIS — I509 Heart failure, unspecified: Secondary | ICD-10-CM | POA: Diagnosis not present

## 2016-03-31 DIAGNOSIS — M6281 Muscle weakness (generalized): Secondary | ICD-10-CM | POA: Diagnosis not present

## 2016-03-31 DIAGNOSIS — I10 Essential (primary) hypertension: Secondary | ICD-10-CM | POA: Diagnosis not present

## 2016-03-31 DIAGNOSIS — M25562 Pain in left knee: Secondary | ICD-10-CM | POA: Diagnosis not present

## 2016-03-31 DIAGNOSIS — F329 Major depressive disorder, single episode, unspecified: Secondary | ICD-10-CM | POA: Diagnosis not present

## 2016-03-31 DIAGNOSIS — F0390 Unspecified dementia without behavioral disturbance: Secondary | ICD-10-CM | POA: Diagnosis not present

## 2016-04-01 DIAGNOSIS — M6281 Muscle weakness (generalized): Secondary | ICD-10-CM | POA: Diagnosis not present

## 2016-04-01 DIAGNOSIS — I10 Essential (primary) hypertension: Secondary | ICD-10-CM | POA: Diagnosis not present

## 2016-04-01 DIAGNOSIS — I509 Heart failure, unspecified: Secondary | ICD-10-CM | POA: Diagnosis not present

## 2016-04-01 DIAGNOSIS — F0390 Unspecified dementia without behavioral disturbance: Secondary | ICD-10-CM | POA: Diagnosis not present

## 2016-04-01 DIAGNOSIS — F329 Major depressive disorder, single episode, unspecified: Secondary | ICD-10-CM | POA: Diagnosis not present

## 2016-04-01 DIAGNOSIS — M25562 Pain in left knee: Secondary | ICD-10-CM | POA: Diagnosis not present

## 2016-04-02 DIAGNOSIS — M6281 Muscle weakness (generalized): Secondary | ICD-10-CM | POA: Diagnosis not present

## 2016-04-02 DIAGNOSIS — I509 Heart failure, unspecified: Secondary | ICD-10-CM | POA: Diagnosis not present

## 2016-04-02 DIAGNOSIS — M25562 Pain in left knee: Secondary | ICD-10-CM | POA: Diagnosis not present

## 2016-04-02 DIAGNOSIS — F329 Major depressive disorder, single episode, unspecified: Secondary | ICD-10-CM | POA: Diagnosis not present

## 2016-04-02 DIAGNOSIS — F0390 Unspecified dementia without behavioral disturbance: Secondary | ICD-10-CM | POA: Diagnosis not present

## 2016-04-02 DIAGNOSIS — I10 Essential (primary) hypertension: Secondary | ICD-10-CM | POA: Diagnosis not present

## 2016-04-09 DIAGNOSIS — F419 Anxiety disorder, unspecified: Secondary | ICD-10-CM | POA: Diagnosis not present

## 2016-04-09 DIAGNOSIS — I5020 Unspecified systolic (congestive) heart failure: Secondary | ICD-10-CM | POA: Diagnosis not present

## 2016-04-09 DIAGNOSIS — M1000 Idiopathic gout, unspecified site: Secondary | ICD-10-CM | POA: Diagnosis not present

## 2016-04-09 DIAGNOSIS — N189 Chronic kidney disease, unspecified: Secondary | ICD-10-CM | POA: Diagnosis not present

## 2016-04-27 DIAGNOSIS — G309 Alzheimer's disease, unspecified: Secondary | ICD-10-CM | POA: Diagnosis not present

## 2016-04-27 DIAGNOSIS — F321 Major depressive disorder, single episode, moderate: Secondary | ICD-10-CM | POA: Diagnosis not present

## 2016-04-27 DIAGNOSIS — F0280 Dementia in other diseases classified elsewhere without behavioral disturbance: Secondary | ICD-10-CM | POA: Diagnosis not present

## 2016-05-02 DIAGNOSIS — I10 Essential (primary) hypertension: Secondary | ICD-10-CM | POA: Diagnosis not present

## 2016-05-02 DIAGNOSIS — G629 Polyneuropathy, unspecified: Secondary | ICD-10-CM | POA: Diagnosis not present

## 2016-05-02 DIAGNOSIS — M109 Gout, unspecified: Secondary | ICD-10-CM | POA: Diagnosis not present

## 2016-05-02 DIAGNOSIS — N189 Chronic kidney disease, unspecified: Secondary | ICD-10-CM | POA: Diagnosis not present

## 2016-05-02 DIAGNOSIS — F0390 Unspecified dementia without behavioral disturbance: Secondary | ICD-10-CM | POA: Diagnosis not present

## 2016-05-29 DIAGNOSIS — I5020 Unspecified systolic (congestive) heart failure: Secondary | ICD-10-CM | POA: Diagnosis not present

## 2016-05-29 DIAGNOSIS — F0390 Unspecified dementia without behavioral disturbance: Secondary | ICD-10-CM | POA: Diagnosis not present

## 2016-05-29 DIAGNOSIS — M1A9XX0 Chronic gout, unspecified, without tophus (tophi): Secondary | ICD-10-CM | POA: Diagnosis not present

## 2016-06-23 DIAGNOSIS — M109 Gout, unspecified: Secondary | ICD-10-CM | POA: Diagnosis not present

## 2016-07-03 DIAGNOSIS — M1A9XX0 Chronic gout, unspecified, without tophus (tophi): Secondary | ICD-10-CM | POA: Diagnosis not present

## 2016-07-03 DIAGNOSIS — I509 Heart failure, unspecified: Secondary | ICD-10-CM | POA: Diagnosis not present

## 2016-07-03 DIAGNOSIS — F331 Major depressive disorder, recurrent, moderate: Secondary | ICD-10-CM | POA: Diagnosis not present

## 2016-07-03 DIAGNOSIS — F0390 Unspecified dementia without behavioral disturbance: Secondary | ICD-10-CM | POA: Diagnosis not present

## 2016-07-05 DIAGNOSIS — G309 Alzheimer's disease, unspecified: Secondary | ICD-10-CM | POA: Diagnosis not present

## 2016-07-05 DIAGNOSIS — F321 Major depressive disorder, single episode, moderate: Secondary | ICD-10-CM | POA: Diagnosis not present

## 2016-07-05 DIAGNOSIS — F0280 Dementia in other diseases classified elsewhere without behavioral disturbance: Secondary | ICD-10-CM | POA: Diagnosis not present

## 2016-07-27 DIAGNOSIS — I69998 Other sequelae following unspecified cerebrovascular disease: Secondary | ICD-10-CM | POA: Diagnosis not present

## 2016-07-27 DIAGNOSIS — F329 Major depressive disorder, single episode, unspecified: Secondary | ICD-10-CM | POA: Diagnosis not present

## 2016-07-27 DIAGNOSIS — R42 Dizziness and giddiness: Secondary | ICD-10-CM | POA: Diagnosis not present

## 2016-07-27 DIAGNOSIS — F0390 Unspecified dementia without behavioral disturbance: Secondary | ICD-10-CM | POA: Diagnosis not present

## 2016-07-27 DIAGNOSIS — I10 Essential (primary) hypertension: Secondary | ICD-10-CM | POA: Diagnosis not present

## 2016-07-27 DIAGNOSIS — M6281 Muscle weakness (generalized): Secondary | ICD-10-CM | POA: Diagnosis not present

## 2016-07-27 DIAGNOSIS — M25561 Pain in right knee: Secondary | ICD-10-CM | POA: Diagnosis not present

## 2016-07-27 DIAGNOSIS — I509 Heart failure, unspecified: Secondary | ICD-10-CM | POA: Diagnosis not present

## 2016-07-27 DIAGNOSIS — R293 Abnormal posture: Secondary | ICD-10-CM | POA: Diagnosis not present

## 2016-07-27 DIAGNOSIS — M25562 Pain in left knee: Secondary | ICD-10-CM | POA: Diagnosis not present

## 2016-07-27 DIAGNOSIS — G629 Polyneuropathy, unspecified: Secondary | ICD-10-CM | POA: Diagnosis not present

## 2016-07-27 DIAGNOSIS — R2689 Other abnormalities of gait and mobility: Secondary | ICD-10-CM | POA: Diagnosis not present

## 2016-07-28 DIAGNOSIS — M6281 Muscle weakness (generalized): Secondary | ICD-10-CM | POA: Diagnosis not present

## 2016-07-28 DIAGNOSIS — G629 Polyneuropathy, unspecified: Secondary | ICD-10-CM | POA: Diagnosis not present

## 2016-07-28 DIAGNOSIS — I69998 Other sequelae following unspecified cerebrovascular disease: Secondary | ICD-10-CM | POA: Diagnosis not present

## 2016-07-28 DIAGNOSIS — M25561 Pain in right knee: Secondary | ICD-10-CM | POA: Diagnosis not present

## 2016-07-28 DIAGNOSIS — F0390 Unspecified dementia without behavioral disturbance: Secondary | ICD-10-CM | POA: Diagnosis not present

## 2016-07-28 DIAGNOSIS — F329 Major depressive disorder, single episode, unspecified: Secondary | ICD-10-CM | POA: Diagnosis not present

## 2016-07-28 DIAGNOSIS — R42 Dizziness and giddiness: Secondary | ICD-10-CM | POA: Diagnosis not present

## 2016-07-28 DIAGNOSIS — M25562 Pain in left knee: Secondary | ICD-10-CM | POA: Diagnosis not present

## 2016-07-28 DIAGNOSIS — R2689 Other abnormalities of gait and mobility: Secondary | ICD-10-CM | POA: Diagnosis not present

## 2016-07-28 DIAGNOSIS — R293 Abnormal posture: Secondary | ICD-10-CM | POA: Diagnosis not present

## 2016-07-28 DIAGNOSIS — I10 Essential (primary) hypertension: Secondary | ICD-10-CM | POA: Diagnosis not present

## 2016-07-28 DIAGNOSIS — I509 Heart failure, unspecified: Secondary | ICD-10-CM | POA: Diagnosis not present

## 2016-07-29 DIAGNOSIS — I10 Essential (primary) hypertension: Secondary | ICD-10-CM | POA: Diagnosis not present

## 2016-07-29 DIAGNOSIS — F0390 Unspecified dementia without behavioral disturbance: Secondary | ICD-10-CM | POA: Diagnosis not present

## 2016-07-29 DIAGNOSIS — M6281 Muscle weakness (generalized): Secondary | ICD-10-CM | POA: Diagnosis not present

## 2016-07-29 DIAGNOSIS — I509 Heart failure, unspecified: Secondary | ICD-10-CM | POA: Diagnosis not present

## 2016-07-29 DIAGNOSIS — F329 Major depressive disorder, single episode, unspecified: Secondary | ICD-10-CM | POA: Diagnosis not present

## 2016-07-29 DIAGNOSIS — M25562 Pain in left knee: Secondary | ICD-10-CM | POA: Diagnosis not present

## 2016-07-30 DIAGNOSIS — I509 Heart failure, unspecified: Secondary | ICD-10-CM | POA: Diagnosis not present

## 2016-07-30 DIAGNOSIS — F329 Major depressive disorder, single episode, unspecified: Secondary | ICD-10-CM | POA: Diagnosis not present

## 2016-07-30 DIAGNOSIS — M25562 Pain in left knee: Secondary | ICD-10-CM | POA: Diagnosis not present

## 2016-07-30 DIAGNOSIS — M6281 Muscle weakness (generalized): Secondary | ICD-10-CM | POA: Diagnosis not present

## 2016-07-30 DIAGNOSIS — I10 Essential (primary) hypertension: Secondary | ICD-10-CM | POA: Diagnosis not present

## 2016-07-30 DIAGNOSIS — F0390 Unspecified dementia without behavioral disturbance: Secondary | ICD-10-CM | POA: Diagnosis not present

## 2016-08-02 DIAGNOSIS — F0390 Unspecified dementia without behavioral disturbance: Secondary | ICD-10-CM | POA: Diagnosis not present

## 2016-08-02 DIAGNOSIS — F329 Major depressive disorder, single episode, unspecified: Secondary | ICD-10-CM | POA: Diagnosis not present

## 2016-08-02 DIAGNOSIS — I10 Essential (primary) hypertension: Secondary | ICD-10-CM | POA: Diagnosis not present

## 2016-08-02 DIAGNOSIS — M25562 Pain in left knee: Secondary | ICD-10-CM | POA: Diagnosis not present

## 2016-08-02 DIAGNOSIS — M6281 Muscle weakness (generalized): Secondary | ICD-10-CM | POA: Diagnosis not present

## 2016-08-02 DIAGNOSIS — I509 Heart failure, unspecified: Secondary | ICD-10-CM | POA: Diagnosis not present

## 2016-08-03 DIAGNOSIS — I10 Essential (primary) hypertension: Secondary | ICD-10-CM | POA: Diagnosis not present

## 2016-08-03 DIAGNOSIS — I509 Heart failure, unspecified: Secondary | ICD-10-CM | POA: Diagnosis not present

## 2016-08-03 DIAGNOSIS — F0390 Unspecified dementia without behavioral disturbance: Secondary | ICD-10-CM | POA: Diagnosis not present

## 2016-08-03 DIAGNOSIS — F329 Major depressive disorder, single episode, unspecified: Secondary | ICD-10-CM | POA: Diagnosis not present

## 2016-08-03 DIAGNOSIS — M6281 Muscle weakness (generalized): Secondary | ICD-10-CM | POA: Diagnosis not present

## 2016-08-03 DIAGNOSIS — M25562 Pain in left knee: Secondary | ICD-10-CM | POA: Diagnosis not present

## 2016-08-04 DIAGNOSIS — F0390 Unspecified dementia without behavioral disturbance: Secondary | ICD-10-CM | POA: Diagnosis not present

## 2016-08-04 DIAGNOSIS — M6281 Muscle weakness (generalized): Secondary | ICD-10-CM | POA: Diagnosis not present

## 2016-08-04 DIAGNOSIS — M25562 Pain in left knee: Secondary | ICD-10-CM | POA: Diagnosis not present

## 2016-08-04 DIAGNOSIS — I10 Essential (primary) hypertension: Secondary | ICD-10-CM | POA: Diagnosis not present

## 2016-08-04 DIAGNOSIS — F329 Major depressive disorder, single episode, unspecified: Secondary | ICD-10-CM | POA: Diagnosis not present

## 2016-08-04 DIAGNOSIS — I509 Heart failure, unspecified: Secondary | ICD-10-CM | POA: Diagnosis not present

## 2016-08-05 DIAGNOSIS — M25562 Pain in left knee: Secondary | ICD-10-CM | POA: Diagnosis not present

## 2016-08-05 DIAGNOSIS — M6281 Muscle weakness (generalized): Secondary | ICD-10-CM | POA: Diagnosis not present

## 2016-08-05 DIAGNOSIS — I10 Essential (primary) hypertension: Secondary | ICD-10-CM | POA: Diagnosis not present

## 2016-08-05 DIAGNOSIS — F0390 Unspecified dementia without behavioral disturbance: Secondary | ICD-10-CM | POA: Diagnosis not present

## 2016-08-05 DIAGNOSIS — F329 Major depressive disorder, single episode, unspecified: Secondary | ICD-10-CM | POA: Diagnosis not present

## 2016-08-05 DIAGNOSIS — I509 Heart failure, unspecified: Secondary | ICD-10-CM | POA: Diagnosis not present

## 2016-08-06 DIAGNOSIS — M25562 Pain in left knee: Secondary | ICD-10-CM | POA: Diagnosis not present

## 2016-08-06 DIAGNOSIS — I10 Essential (primary) hypertension: Secondary | ICD-10-CM | POA: Diagnosis not present

## 2016-08-06 DIAGNOSIS — I509 Heart failure, unspecified: Secondary | ICD-10-CM | POA: Diagnosis not present

## 2016-08-06 DIAGNOSIS — F329 Major depressive disorder, single episode, unspecified: Secondary | ICD-10-CM | POA: Diagnosis not present

## 2016-08-06 DIAGNOSIS — F0390 Unspecified dementia without behavioral disturbance: Secondary | ICD-10-CM | POA: Diagnosis not present

## 2016-08-06 DIAGNOSIS — M6281 Muscle weakness (generalized): Secondary | ICD-10-CM | POA: Diagnosis not present

## 2016-08-09 DIAGNOSIS — M25562 Pain in left knee: Secondary | ICD-10-CM | POA: Diagnosis not present

## 2016-08-09 DIAGNOSIS — I10 Essential (primary) hypertension: Secondary | ICD-10-CM | POA: Diagnosis not present

## 2016-08-09 DIAGNOSIS — F0390 Unspecified dementia without behavioral disturbance: Secondary | ICD-10-CM | POA: Diagnosis not present

## 2016-08-09 DIAGNOSIS — M6281 Muscle weakness (generalized): Secondary | ICD-10-CM | POA: Diagnosis not present

## 2016-08-09 DIAGNOSIS — F329 Major depressive disorder, single episode, unspecified: Secondary | ICD-10-CM | POA: Diagnosis not present

## 2016-08-09 DIAGNOSIS — I509 Heart failure, unspecified: Secondary | ICD-10-CM | POA: Diagnosis not present

## 2016-08-10 DIAGNOSIS — M25562 Pain in left knee: Secondary | ICD-10-CM | POA: Diagnosis not present

## 2016-08-10 DIAGNOSIS — F0390 Unspecified dementia without behavioral disturbance: Secondary | ICD-10-CM | POA: Diagnosis not present

## 2016-08-10 DIAGNOSIS — M6281 Muscle weakness (generalized): Secondary | ICD-10-CM | POA: Diagnosis not present

## 2016-08-10 DIAGNOSIS — F329 Major depressive disorder, single episode, unspecified: Secondary | ICD-10-CM | POA: Diagnosis not present

## 2016-08-10 DIAGNOSIS — I509 Heart failure, unspecified: Secondary | ICD-10-CM | POA: Diagnosis not present

## 2016-08-10 DIAGNOSIS — I10 Essential (primary) hypertension: Secondary | ICD-10-CM | POA: Diagnosis not present

## 2016-08-11 DIAGNOSIS — F0390 Unspecified dementia without behavioral disturbance: Secondary | ICD-10-CM | POA: Diagnosis not present

## 2016-08-11 DIAGNOSIS — M6281 Muscle weakness (generalized): Secondary | ICD-10-CM | POA: Diagnosis not present

## 2016-08-11 DIAGNOSIS — I10 Essential (primary) hypertension: Secondary | ICD-10-CM | POA: Diagnosis not present

## 2016-08-11 DIAGNOSIS — I509 Heart failure, unspecified: Secondary | ICD-10-CM | POA: Diagnosis not present

## 2016-08-11 DIAGNOSIS — F329 Major depressive disorder, single episode, unspecified: Secondary | ICD-10-CM | POA: Diagnosis not present

## 2016-08-11 DIAGNOSIS — M25562 Pain in left knee: Secondary | ICD-10-CM | POA: Diagnosis not present

## 2016-08-12 DIAGNOSIS — I10 Essential (primary) hypertension: Secondary | ICD-10-CM | POA: Diagnosis not present

## 2016-08-12 DIAGNOSIS — F0390 Unspecified dementia without behavioral disturbance: Secondary | ICD-10-CM | POA: Diagnosis not present

## 2016-08-12 DIAGNOSIS — F329 Major depressive disorder, single episode, unspecified: Secondary | ICD-10-CM | POA: Diagnosis not present

## 2016-08-12 DIAGNOSIS — M25562 Pain in left knee: Secondary | ICD-10-CM | POA: Diagnosis not present

## 2016-08-12 DIAGNOSIS — W19XXXA Unspecified fall, initial encounter: Secondary | ICD-10-CM | POA: Diagnosis not present

## 2016-08-12 DIAGNOSIS — M25559 Pain in unspecified hip: Secondary | ICD-10-CM | POA: Diagnosis not present

## 2016-08-12 DIAGNOSIS — M25561 Pain in right knee: Secondary | ICD-10-CM | POA: Diagnosis not present

## 2016-08-12 DIAGNOSIS — I509 Heart failure, unspecified: Secondary | ICD-10-CM | POA: Diagnosis not present

## 2016-08-12 DIAGNOSIS — M6281 Muscle weakness (generalized): Secondary | ICD-10-CM | POA: Diagnosis not present

## 2016-08-13 DIAGNOSIS — F329 Major depressive disorder, single episode, unspecified: Secondary | ICD-10-CM | POA: Diagnosis not present

## 2016-08-13 DIAGNOSIS — M6281 Muscle weakness (generalized): Secondary | ICD-10-CM | POA: Diagnosis not present

## 2016-08-13 DIAGNOSIS — M25562 Pain in left knee: Secondary | ICD-10-CM | POA: Diagnosis not present

## 2016-08-13 DIAGNOSIS — F0390 Unspecified dementia without behavioral disturbance: Secondary | ICD-10-CM | POA: Diagnosis not present

## 2016-08-13 DIAGNOSIS — I509 Heart failure, unspecified: Secondary | ICD-10-CM | POA: Diagnosis not present

## 2016-08-13 DIAGNOSIS — I10 Essential (primary) hypertension: Secondary | ICD-10-CM | POA: Diagnosis not present

## 2016-08-14 DIAGNOSIS — I509 Heart failure, unspecified: Secondary | ICD-10-CM | POA: Diagnosis not present

## 2016-08-14 DIAGNOSIS — I679 Cerebrovascular disease, unspecified: Secondary | ICD-10-CM | POA: Diagnosis not present

## 2016-08-14 DIAGNOSIS — F0390 Unspecified dementia without behavioral disturbance: Secondary | ICD-10-CM | POA: Diagnosis not present

## 2016-08-14 DIAGNOSIS — N189 Chronic kidney disease, unspecified: Secondary | ICD-10-CM | POA: Diagnosis not present

## 2016-08-16 DIAGNOSIS — I509 Heart failure, unspecified: Secondary | ICD-10-CM | POA: Diagnosis not present

## 2016-08-16 DIAGNOSIS — F0390 Unspecified dementia without behavioral disturbance: Secondary | ICD-10-CM | POA: Diagnosis not present

## 2016-08-16 DIAGNOSIS — M6281 Muscle weakness (generalized): Secondary | ICD-10-CM | POA: Diagnosis not present

## 2016-08-16 DIAGNOSIS — F329 Major depressive disorder, single episode, unspecified: Secondary | ICD-10-CM | POA: Diagnosis not present

## 2016-08-16 DIAGNOSIS — I10 Essential (primary) hypertension: Secondary | ICD-10-CM | POA: Diagnosis not present

## 2016-08-16 DIAGNOSIS — M25562 Pain in left knee: Secondary | ICD-10-CM | POA: Diagnosis not present

## 2016-08-17 DIAGNOSIS — I509 Heart failure, unspecified: Secondary | ICD-10-CM | POA: Diagnosis not present

## 2016-08-17 DIAGNOSIS — F329 Major depressive disorder, single episode, unspecified: Secondary | ICD-10-CM | POA: Diagnosis not present

## 2016-08-17 DIAGNOSIS — I10 Essential (primary) hypertension: Secondary | ICD-10-CM | POA: Diagnosis not present

## 2016-08-17 DIAGNOSIS — F0390 Unspecified dementia without behavioral disturbance: Secondary | ICD-10-CM | POA: Diagnosis not present

## 2016-08-17 DIAGNOSIS — M25562 Pain in left knee: Secondary | ICD-10-CM | POA: Diagnosis not present

## 2016-08-17 DIAGNOSIS — M6281 Muscle weakness (generalized): Secondary | ICD-10-CM | POA: Diagnosis not present

## 2016-08-18 DIAGNOSIS — F0390 Unspecified dementia without behavioral disturbance: Secondary | ICD-10-CM | POA: Diagnosis not present

## 2016-08-18 DIAGNOSIS — I509 Heart failure, unspecified: Secondary | ICD-10-CM | POA: Diagnosis not present

## 2016-08-18 DIAGNOSIS — M25562 Pain in left knee: Secondary | ICD-10-CM | POA: Diagnosis not present

## 2016-08-18 DIAGNOSIS — F329 Major depressive disorder, single episode, unspecified: Secondary | ICD-10-CM | POA: Diagnosis not present

## 2016-08-18 DIAGNOSIS — I10 Essential (primary) hypertension: Secondary | ICD-10-CM | POA: Diagnosis not present

## 2016-08-18 DIAGNOSIS — M6281 Muscle weakness (generalized): Secondary | ICD-10-CM | POA: Diagnosis not present

## 2016-08-21 DIAGNOSIS — I10 Essential (primary) hypertension: Secondary | ICD-10-CM | POA: Diagnosis not present

## 2016-08-21 DIAGNOSIS — M25562 Pain in left knee: Secondary | ICD-10-CM | POA: Diagnosis not present

## 2016-08-21 DIAGNOSIS — F329 Major depressive disorder, single episode, unspecified: Secondary | ICD-10-CM | POA: Diagnosis not present

## 2016-08-21 DIAGNOSIS — I509 Heart failure, unspecified: Secondary | ICD-10-CM | POA: Diagnosis not present

## 2016-08-21 DIAGNOSIS — M6281 Muscle weakness (generalized): Secondary | ICD-10-CM | POA: Diagnosis not present

## 2016-08-21 DIAGNOSIS — F0390 Unspecified dementia without behavioral disturbance: Secondary | ICD-10-CM | POA: Diagnosis not present

## 2016-08-23 DIAGNOSIS — F329 Major depressive disorder, single episode, unspecified: Secondary | ICD-10-CM | POA: Diagnosis not present

## 2016-08-23 DIAGNOSIS — I509 Heart failure, unspecified: Secondary | ICD-10-CM | POA: Diagnosis not present

## 2016-08-23 DIAGNOSIS — I10 Essential (primary) hypertension: Secondary | ICD-10-CM | POA: Diagnosis not present

## 2016-08-23 DIAGNOSIS — M25562 Pain in left knee: Secondary | ICD-10-CM | POA: Diagnosis not present

## 2016-08-23 DIAGNOSIS — F0390 Unspecified dementia without behavioral disturbance: Secondary | ICD-10-CM | POA: Diagnosis not present

## 2016-08-23 DIAGNOSIS — M6281 Muscle weakness (generalized): Secondary | ICD-10-CM | POA: Diagnosis not present

## 2016-08-25 DIAGNOSIS — M6281 Muscle weakness (generalized): Secondary | ICD-10-CM | POA: Diagnosis not present

## 2016-08-25 DIAGNOSIS — I509 Heart failure, unspecified: Secondary | ICD-10-CM | POA: Diagnosis not present

## 2016-08-25 DIAGNOSIS — F329 Major depressive disorder, single episode, unspecified: Secondary | ICD-10-CM | POA: Diagnosis not present

## 2016-08-25 DIAGNOSIS — F0390 Unspecified dementia without behavioral disturbance: Secondary | ICD-10-CM | POA: Diagnosis not present

## 2016-08-25 DIAGNOSIS — I10 Essential (primary) hypertension: Secondary | ICD-10-CM | POA: Diagnosis not present

## 2016-08-25 DIAGNOSIS — M25562 Pain in left knee: Secondary | ICD-10-CM | POA: Diagnosis not present

## 2016-08-27 DIAGNOSIS — I69998 Other sequelae following unspecified cerebrovascular disease: Secondary | ICD-10-CM | POA: Diagnosis not present

## 2016-08-27 DIAGNOSIS — I10 Essential (primary) hypertension: Secondary | ICD-10-CM | POA: Diagnosis not present

## 2016-08-27 DIAGNOSIS — F329 Major depressive disorder, single episode, unspecified: Secondary | ICD-10-CM | POA: Diagnosis not present

## 2016-08-27 DIAGNOSIS — R293 Abnormal posture: Secondary | ICD-10-CM | POA: Diagnosis not present

## 2016-08-27 DIAGNOSIS — I509 Heart failure, unspecified: Secondary | ICD-10-CM | POA: Diagnosis not present

## 2016-08-27 DIAGNOSIS — M25562 Pain in left knee: Secondary | ICD-10-CM | POA: Diagnosis not present

## 2016-08-27 DIAGNOSIS — R2689 Other abnormalities of gait and mobility: Secondary | ICD-10-CM | POA: Diagnosis not present

## 2016-08-27 DIAGNOSIS — G629 Polyneuropathy, unspecified: Secondary | ICD-10-CM | POA: Diagnosis not present

## 2016-08-27 DIAGNOSIS — F0390 Unspecified dementia without behavioral disturbance: Secondary | ICD-10-CM | POA: Diagnosis not present

## 2016-08-27 DIAGNOSIS — M6281 Muscle weakness (generalized): Secondary | ICD-10-CM | POA: Diagnosis not present

## 2016-08-27 DIAGNOSIS — R42 Dizziness and giddiness: Secondary | ICD-10-CM | POA: Diagnosis not present

## 2016-08-27 DIAGNOSIS — M25561 Pain in right knee: Secondary | ICD-10-CM | POA: Diagnosis not present

## 2016-08-30 DIAGNOSIS — F329 Major depressive disorder, single episode, unspecified: Secondary | ICD-10-CM | POA: Diagnosis not present

## 2016-08-30 DIAGNOSIS — I10 Essential (primary) hypertension: Secondary | ICD-10-CM | POA: Diagnosis not present

## 2016-08-30 DIAGNOSIS — F0390 Unspecified dementia without behavioral disturbance: Secondary | ICD-10-CM | POA: Diagnosis not present

## 2016-08-30 DIAGNOSIS — I509 Heart failure, unspecified: Secondary | ICD-10-CM | POA: Diagnosis not present

## 2016-08-30 DIAGNOSIS — M25562 Pain in left knee: Secondary | ICD-10-CM | POA: Diagnosis not present

## 2016-08-30 DIAGNOSIS — M6281 Muscle weakness (generalized): Secondary | ICD-10-CM | POA: Diagnosis not present

## 2016-09-01 DIAGNOSIS — F329 Major depressive disorder, single episode, unspecified: Secondary | ICD-10-CM | POA: Diagnosis not present

## 2016-09-01 DIAGNOSIS — M6281 Muscle weakness (generalized): Secondary | ICD-10-CM | POA: Diagnosis not present

## 2016-09-01 DIAGNOSIS — M25562 Pain in left knee: Secondary | ICD-10-CM | POA: Diagnosis not present

## 2016-09-01 DIAGNOSIS — F0390 Unspecified dementia without behavioral disturbance: Secondary | ICD-10-CM | POA: Diagnosis not present

## 2016-09-01 DIAGNOSIS — I10 Essential (primary) hypertension: Secondary | ICD-10-CM | POA: Diagnosis not present

## 2016-09-01 DIAGNOSIS — I509 Heart failure, unspecified: Secondary | ICD-10-CM | POA: Diagnosis not present

## 2016-09-03 DIAGNOSIS — I509 Heart failure, unspecified: Secondary | ICD-10-CM | POA: Diagnosis not present

## 2016-09-03 DIAGNOSIS — M25562 Pain in left knee: Secondary | ICD-10-CM | POA: Diagnosis not present

## 2016-09-03 DIAGNOSIS — M6281 Muscle weakness (generalized): Secondary | ICD-10-CM | POA: Diagnosis not present

## 2016-09-03 DIAGNOSIS — I10 Essential (primary) hypertension: Secondary | ICD-10-CM | POA: Diagnosis not present

## 2016-09-03 DIAGNOSIS — F329 Major depressive disorder, single episode, unspecified: Secondary | ICD-10-CM | POA: Diagnosis not present

## 2016-09-03 DIAGNOSIS — F0390 Unspecified dementia without behavioral disturbance: Secondary | ICD-10-CM | POA: Diagnosis not present

## 2016-09-06 DIAGNOSIS — M25562 Pain in left knee: Secondary | ICD-10-CM | POA: Diagnosis not present

## 2016-09-06 DIAGNOSIS — F0390 Unspecified dementia without behavioral disturbance: Secondary | ICD-10-CM | POA: Diagnosis not present

## 2016-09-06 DIAGNOSIS — I509 Heart failure, unspecified: Secondary | ICD-10-CM | POA: Diagnosis not present

## 2016-09-06 DIAGNOSIS — F329 Major depressive disorder, single episode, unspecified: Secondary | ICD-10-CM | POA: Diagnosis not present

## 2016-09-06 DIAGNOSIS — M6281 Muscle weakness (generalized): Secondary | ICD-10-CM | POA: Diagnosis not present

## 2016-09-06 DIAGNOSIS — I10 Essential (primary) hypertension: Secondary | ICD-10-CM | POA: Diagnosis not present

## 2016-09-08 DIAGNOSIS — I509 Heart failure, unspecified: Secondary | ICD-10-CM | POA: Diagnosis not present

## 2016-09-08 DIAGNOSIS — M25562 Pain in left knee: Secondary | ICD-10-CM | POA: Diagnosis not present

## 2016-09-08 DIAGNOSIS — F329 Major depressive disorder, single episode, unspecified: Secondary | ICD-10-CM | POA: Diagnosis not present

## 2016-09-08 DIAGNOSIS — I10 Essential (primary) hypertension: Secondary | ICD-10-CM | POA: Diagnosis not present

## 2016-09-08 DIAGNOSIS — F0390 Unspecified dementia without behavioral disturbance: Secondary | ICD-10-CM | POA: Diagnosis not present

## 2016-09-08 DIAGNOSIS — M6281 Muscle weakness (generalized): Secondary | ICD-10-CM | POA: Diagnosis not present

## 2016-09-09 DIAGNOSIS — H353132 Nonexudative age-related macular degeneration, bilateral, intermediate dry stage: Secondary | ICD-10-CM | POA: Diagnosis not present

## 2016-09-10 DIAGNOSIS — F321 Major depressive disorder, single episode, moderate: Secondary | ICD-10-CM | POA: Diagnosis not present

## 2016-09-10 DIAGNOSIS — I509 Heart failure, unspecified: Secondary | ICD-10-CM | POA: Diagnosis not present

## 2016-09-10 DIAGNOSIS — F0280 Dementia in other diseases classified elsewhere without behavioral disturbance: Secondary | ICD-10-CM | POA: Diagnosis not present

## 2016-09-10 DIAGNOSIS — F0390 Unspecified dementia without behavioral disturbance: Secondary | ICD-10-CM | POA: Diagnosis not present

## 2016-09-10 DIAGNOSIS — M6281 Muscle weakness (generalized): Secondary | ICD-10-CM | POA: Diagnosis not present

## 2016-09-10 DIAGNOSIS — F329 Major depressive disorder, single episode, unspecified: Secondary | ICD-10-CM | POA: Diagnosis not present

## 2016-09-10 DIAGNOSIS — I10 Essential (primary) hypertension: Secondary | ICD-10-CM | POA: Diagnosis not present

## 2016-09-10 DIAGNOSIS — G309 Alzheimer's disease, unspecified: Secondary | ICD-10-CM | POA: Diagnosis not present

## 2016-09-10 DIAGNOSIS — M25562 Pain in left knee: Secondary | ICD-10-CM | POA: Diagnosis not present

## 2016-09-13 DIAGNOSIS — I509 Heart failure, unspecified: Secondary | ICD-10-CM | POA: Diagnosis not present

## 2016-09-13 DIAGNOSIS — I10 Essential (primary) hypertension: Secondary | ICD-10-CM | POA: Diagnosis not present

## 2016-09-13 DIAGNOSIS — M25562 Pain in left knee: Secondary | ICD-10-CM | POA: Diagnosis not present

## 2016-09-13 DIAGNOSIS — F0390 Unspecified dementia without behavioral disturbance: Secondary | ICD-10-CM | POA: Diagnosis not present

## 2016-09-13 DIAGNOSIS — F329 Major depressive disorder, single episode, unspecified: Secondary | ICD-10-CM | POA: Diagnosis not present

## 2016-09-13 DIAGNOSIS — M6281 Muscle weakness (generalized): Secondary | ICD-10-CM | POA: Diagnosis not present

## 2016-09-14 DIAGNOSIS — Z0000 Encounter for general adult medical examination without abnormal findings: Secondary | ICD-10-CM | POA: Diagnosis not present

## 2016-09-14 DIAGNOSIS — I509 Heart failure, unspecified: Secondary | ICD-10-CM | POA: Diagnosis not present

## 2016-09-14 DIAGNOSIS — Z1389 Encounter for screening for other disorder: Secondary | ICD-10-CM | POA: Diagnosis not present

## 2016-09-14 DIAGNOSIS — F329 Major depressive disorder, single episode, unspecified: Secondary | ICD-10-CM | POA: Diagnosis not present

## 2016-09-14 DIAGNOSIS — F0390 Unspecified dementia without behavioral disturbance: Secondary | ICD-10-CM | POA: Diagnosis not present

## 2016-09-14 DIAGNOSIS — Z9181 History of falling: Secondary | ICD-10-CM | POA: Diagnosis not present

## 2016-09-14 DIAGNOSIS — Z139 Encounter for screening, unspecified: Secondary | ICD-10-CM | POA: Diagnosis not present

## 2016-09-15 DIAGNOSIS — F329 Major depressive disorder, single episode, unspecified: Secondary | ICD-10-CM | POA: Diagnosis not present

## 2016-09-15 DIAGNOSIS — I10 Essential (primary) hypertension: Secondary | ICD-10-CM | POA: Diagnosis not present

## 2016-09-15 DIAGNOSIS — M25562 Pain in left knee: Secondary | ICD-10-CM | POA: Diagnosis not present

## 2016-09-15 DIAGNOSIS — I509 Heart failure, unspecified: Secondary | ICD-10-CM | POA: Diagnosis not present

## 2016-09-15 DIAGNOSIS — M6281 Muscle weakness (generalized): Secondary | ICD-10-CM | POA: Diagnosis not present

## 2016-09-15 DIAGNOSIS — F0390 Unspecified dementia without behavioral disturbance: Secondary | ICD-10-CM | POA: Diagnosis not present

## 2016-09-23 DIAGNOSIS — F0390 Unspecified dementia without behavioral disturbance: Secondary | ICD-10-CM | POA: Diagnosis not present

## 2016-09-23 DIAGNOSIS — F329 Major depressive disorder, single episode, unspecified: Secondary | ICD-10-CM | POA: Diagnosis not present

## 2016-09-23 DIAGNOSIS — I679 Cerebrovascular disease, unspecified: Secondary | ICD-10-CM | POA: Diagnosis not present

## 2016-09-23 DIAGNOSIS — I509 Heart failure, unspecified: Secondary | ICD-10-CM | POA: Diagnosis not present

## 2016-10-24 DIAGNOSIS — M1A9XX0 Chronic gout, unspecified, without tophus (tophi): Secondary | ICD-10-CM | POA: Diagnosis not present

## 2016-10-24 DIAGNOSIS — F0390 Unspecified dementia without behavioral disturbance: Secondary | ICD-10-CM | POA: Diagnosis not present

## 2016-10-24 DIAGNOSIS — N189 Chronic kidney disease, unspecified: Secondary | ICD-10-CM | POA: Diagnosis not present

## 2016-10-24 DIAGNOSIS — I509 Heart failure, unspecified: Secondary | ICD-10-CM | POA: Diagnosis not present

## 2016-10-27 DIAGNOSIS — F0390 Unspecified dementia without behavioral disturbance: Secondary | ICD-10-CM | POA: Diagnosis not present

## 2016-10-27 DIAGNOSIS — I69998 Other sequelae following unspecified cerebrovascular disease: Secondary | ICD-10-CM | POA: Diagnosis not present

## 2016-10-27 DIAGNOSIS — I509 Heart failure, unspecified: Secondary | ICD-10-CM | POA: Diagnosis not present

## 2016-10-27 DIAGNOSIS — M1990 Unspecified osteoarthritis, unspecified site: Secondary | ICD-10-CM | POA: Diagnosis not present

## 2016-10-27 DIAGNOSIS — I10 Essential (primary) hypertension: Secondary | ICD-10-CM | POA: Diagnosis not present

## 2016-10-27 DIAGNOSIS — M25561 Pain in right knee: Secondary | ICD-10-CM | POA: Diagnosis not present

## 2016-10-27 DIAGNOSIS — R2689 Other abnormalities of gait and mobility: Secondary | ICD-10-CM | POA: Diagnosis not present

## 2016-10-27 DIAGNOSIS — M8589 Other specified disorders of bone density and structure, multiple sites: Secondary | ICD-10-CM | POA: Diagnosis not present

## 2016-10-27 DIAGNOSIS — M6281 Muscle weakness (generalized): Secondary | ICD-10-CM | POA: Diagnosis not present

## 2016-10-27 DIAGNOSIS — F329 Major depressive disorder, single episode, unspecified: Secondary | ICD-10-CM | POA: Diagnosis not present

## 2016-10-27 DIAGNOSIS — R42 Dizziness and giddiness: Secondary | ICD-10-CM | POA: Diagnosis not present

## 2016-10-27 DIAGNOSIS — G629 Polyneuropathy, unspecified: Secondary | ICD-10-CM | POA: Diagnosis not present

## 2016-10-27 DIAGNOSIS — R293 Abnormal posture: Secondary | ICD-10-CM | POA: Diagnosis not present

## 2016-10-27 DIAGNOSIS — M25562 Pain in left knee: Secondary | ICD-10-CM | POA: Diagnosis not present

## 2016-10-28 DIAGNOSIS — R2689 Other abnormalities of gait and mobility: Secondary | ICD-10-CM | POA: Diagnosis not present

## 2016-10-28 DIAGNOSIS — M25562 Pain in left knee: Secondary | ICD-10-CM | POA: Diagnosis not present

## 2016-10-28 DIAGNOSIS — M6281 Muscle weakness (generalized): Secondary | ICD-10-CM | POA: Diagnosis not present

## 2016-10-28 DIAGNOSIS — F0390 Unspecified dementia without behavioral disturbance: Secondary | ICD-10-CM | POA: Diagnosis not present

## 2016-10-28 DIAGNOSIS — M8589 Other specified disorders of bone density and structure, multiple sites: Secondary | ICD-10-CM | POA: Diagnosis not present

## 2016-10-28 DIAGNOSIS — M25561 Pain in right knee: Secondary | ICD-10-CM | POA: Diagnosis not present

## 2016-10-28 DIAGNOSIS — I69998 Other sequelae following unspecified cerebrovascular disease: Secondary | ICD-10-CM | POA: Diagnosis not present

## 2016-10-28 DIAGNOSIS — I10 Essential (primary) hypertension: Secondary | ICD-10-CM | POA: Diagnosis not present

## 2016-10-28 DIAGNOSIS — I509 Heart failure, unspecified: Secondary | ICD-10-CM | POA: Diagnosis not present

## 2016-10-28 DIAGNOSIS — M1990 Unspecified osteoarthritis, unspecified site: Secondary | ICD-10-CM | POA: Diagnosis not present

## 2016-10-28 DIAGNOSIS — R293 Abnormal posture: Secondary | ICD-10-CM | POA: Diagnosis not present

## 2016-10-28 DIAGNOSIS — F329 Major depressive disorder, single episode, unspecified: Secondary | ICD-10-CM | POA: Diagnosis not present

## 2016-10-28 DIAGNOSIS — G629 Polyneuropathy, unspecified: Secondary | ICD-10-CM | POA: Diagnosis not present

## 2016-10-28 DIAGNOSIS — R42 Dizziness and giddiness: Secondary | ICD-10-CM | POA: Diagnosis not present

## 2016-10-29 DIAGNOSIS — M25562 Pain in left knee: Secondary | ICD-10-CM | POA: Diagnosis not present

## 2016-10-29 DIAGNOSIS — F0390 Unspecified dementia without behavioral disturbance: Secondary | ICD-10-CM | POA: Diagnosis not present

## 2016-10-29 DIAGNOSIS — F329 Major depressive disorder, single episode, unspecified: Secondary | ICD-10-CM | POA: Diagnosis not present

## 2016-10-29 DIAGNOSIS — I509 Heart failure, unspecified: Secondary | ICD-10-CM | POA: Diagnosis not present

## 2016-10-29 DIAGNOSIS — M6281 Muscle weakness (generalized): Secondary | ICD-10-CM | POA: Diagnosis not present

## 2016-10-29 DIAGNOSIS — I10 Essential (primary) hypertension: Secondary | ICD-10-CM | POA: Diagnosis not present

## 2016-11-01 DIAGNOSIS — I509 Heart failure, unspecified: Secondary | ICD-10-CM | POA: Diagnosis not present

## 2016-11-01 DIAGNOSIS — M25562 Pain in left knee: Secondary | ICD-10-CM | POA: Diagnosis not present

## 2016-11-01 DIAGNOSIS — I10 Essential (primary) hypertension: Secondary | ICD-10-CM | POA: Diagnosis not present

## 2016-11-01 DIAGNOSIS — M6281 Muscle weakness (generalized): Secondary | ICD-10-CM | POA: Diagnosis not present

## 2016-11-01 DIAGNOSIS — F0390 Unspecified dementia without behavioral disturbance: Secondary | ICD-10-CM | POA: Diagnosis not present

## 2016-11-01 DIAGNOSIS — F329 Major depressive disorder, single episode, unspecified: Secondary | ICD-10-CM | POA: Diagnosis not present

## 2016-11-02 DIAGNOSIS — I509 Heart failure, unspecified: Secondary | ICD-10-CM | POA: Diagnosis not present

## 2016-11-02 DIAGNOSIS — M6281 Muscle weakness (generalized): Secondary | ICD-10-CM | POA: Diagnosis not present

## 2016-11-02 DIAGNOSIS — I10 Essential (primary) hypertension: Secondary | ICD-10-CM | POA: Diagnosis not present

## 2016-11-02 DIAGNOSIS — M25562 Pain in left knee: Secondary | ICD-10-CM | POA: Diagnosis not present

## 2016-11-02 DIAGNOSIS — F0390 Unspecified dementia without behavioral disturbance: Secondary | ICD-10-CM | POA: Diagnosis not present

## 2016-11-02 DIAGNOSIS — F329 Major depressive disorder, single episode, unspecified: Secondary | ICD-10-CM | POA: Diagnosis not present

## 2016-11-03 DIAGNOSIS — F329 Major depressive disorder, single episode, unspecified: Secondary | ICD-10-CM | POA: Diagnosis not present

## 2016-11-03 DIAGNOSIS — I10 Essential (primary) hypertension: Secondary | ICD-10-CM | POA: Diagnosis not present

## 2016-11-03 DIAGNOSIS — I509 Heart failure, unspecified: Secondary | ICD-10-CM | POA: Diagnosis not present

## 2016-11-03 DIAGNOSIS — M25562 Pain in left knee: Secondary | ICD-10-CM | POA: Diagnosis not present

## 2016-11-03 DIAGNOSIS — F0390 Unspecified dementia without behavioral disturbance: Secondary | ICD-10-CM | POA: Diagnosis not present

## 2016-11-03 DIAGNOSIS — M6281 Muscle weakness (generalized): Secondary | ICD-10-CM | POA: Diagnosis not present

## 2016-11-04 DIAGNOSIS — F329 Major depressive disorder, single episode, unspecified: Secondary | ICD-10-CM | POA: Diagnosis not present

## 2016-11-04 DIAGNOSIS — M6281 Muscle weakness (generalized): Secondary | ICD-10-CM | POA: Diagnosis not present

## 2016-11-04 DIAGNOSIS — F0390 Unspecified dementia without behavioral disturbance: Secondary | ICD-10-CM | POA: Diagnosis not present

## 2016-11-04 DIAGNOSIS — I10 Essential (primary) hypertension: Secondary | ICD-10-CM | POA: Diagnosis not present

## 2016-11-04 DIAGNOSIS — M25562 Pain in left knee: Secondary | ICD-10-CM | POA: Diagnosis not present

## 2016-11-04 DIAGNOSIS — I509 Heart failure, unspecified: Secondary | ICD-10-CM | POA: Diagnosis not present

## 2016-11-06 DIAGNOSIS — F0390 Unspecified dementia without behavioral disturbance: Secondary | ICD-10-CM | POA: Diagnosis not present

## 2016-11-06 DIAGNOSIS — I10 Essential (primary) hypertension: Secondary | ICD-10-CM | POA: Diagnosis not present

## 2016-11-06 DIAGNOSIS — F329 Major depressive disorder, single episode, unspecified: Secondary | ICD-10-CM | POA: Diagnosis not present

## 2016-11-06 DIAGNOSIS — I509 Heart failure, unspecified: Secondary | ICD-10-CM | POA: Diagnosis not present

## 2016-11-06 DIAGNOSIS — M6281 Muscle weakness (generalized): Secondary | ICD-10-CM | POA: Diagnosis not present

## 2016-11-06 DIAGNOSIS — M25562 Pain in left knee: Secondary | ICD-10-CM | POA: Diagnosis not present

## 2016-11-08 DIAGNOSIS — M6281 Muscle weakness (generalized): Secondary | ICD-10-CM | POA: Diagnosis not present

## 2016-11-08 DIAGNOSIS — F0390 Unspecified dementia without behavioral disturbance: Secondary | ICD-10-CM | POA: Diagnosis not present

## 2016-11-08 DIAGNOSIS — I10 Essential (primary) hypertension: Secondary | ICD-10-CM | POA: Diagnosis not present

## 2016-11-08 DIAGNOSIS — I509 Heart failure, unspecified: Secondary | ICD-10-CM | POA: Diagnosis not present

## 2016-11-08 DIAGNOSIS — M25562 Pain in left knee: Secondary | ICD-10-CM | POA: Diagnosis not present

## 2016-11-08 DIAGNOSIS — F329 Major depressive disorder, single episode, unspecified: Secondary | ICD-10-CM | POA: Diagnosis not present

## 2016-11-09 DIAGNOSIS — F329 Major depressive disorder, single episode, unspecified: Secondary | ICD-10-CM | POA: Diagnosis not present

## 2016-11-09 DIAGNOSIS — M6281 Muscle weakness (generalized): Secondary | ICD-10-CM | POA: Diagnosis not present

## 2016-11-09 DIAGNOSIS — I509 Heart failure, unspecified: Secondary | ICD-10-CM | POA: Diagnosis not present

## 2016-11-09 DIAGNOSIS — I10 Essential (primary) hypertension: Secondary | ICD-10-CM | POA: Diagnosis not present

## 2016-11-09 DIAGNOSIS — M25562 Pain in left knee: Secondary | ICD-10-CM | POA: Diagnosis not present

## 2016-11-09 DIAGNOSIS — F0390 Unspecified dementia without behavioral disturbance: Secondary | ICD-10-CM | POA: Diagnosis not present

## 2016-11-10 DIAGNOSIS — F0390 Unspecified dementia without behavioral disturbance: Secondary | ICD-10-CM | POA: Diagnosis not present

## 2016-11-10 DIAGNOSIS — M6281 Muscle weakness (generalized): Secondary | ICD-10-CM | POA: Diagnosis not present

## 2016-11-10 DIAGNOSIS — I10 Essential (primary) hypertension: Secondary | ICD-10-CM | POA: Diagnosis not present

## 2016-11-10 DIAGNOSIS — F329 Major depressive disorder, single episode, unspecified: Secondary | ICD-10-CM | POA: Diagnosis not present

## 2016-11-10 DIAGNOSIS — I509 Heart failure, unspecified: Secondary | ICD-10-CM | POA: Diagnosis not present

## 2016-11-10 DIAGNOSIS — M25562 Pain in left knee: Secondary | ICD-10-CM | POA: Diagnosis not present

## 2016-11-11 DIAGNOSIS — M6281 Muscle weakness (generalized): Secondary | ICD-10-CM | POA: Diagnosis not present

## 2016-11-11 DIAGNOSIS — F0390 Unspecified dementia without behavioral disturbance: Secondary | ICD-10-CM | POA: Diagnosis not present

## 2016-11-11 DIAGNOSIS — F329 Major depressive disorder, single episode, unspecified: Secondary | ICD-10-CM | POA: Diagnosis not present

## 2016-11-11 DIAGNOSIS — I509 Heart failure, unspecified: Secondary | ICD-10-CM | POA: Diagnosis not present

## 2016-11-11 DIAGNOSIS — M25562 Pain in left knee: Secondary | ICD-10-CM | POA: Diagnosis not present

## 2016-11-11 DIAGNOSIS — I10 Essential (primary) hypertension: Secondary | ICD-10-CM | POA: Diagnosis not present

## 2016-11-16 DIAGNOSIS — Z79899 Other long term (current) drug therapy: Secondary | ICD-10-CM | POA: Diagnosis not present

## 2016-11-22 ENCOUNTER — Telehealth (INDEPENDENT_AMBULATORY_CARE_PROVIDER_SITE_OTHER): Payer: Self-pay | Admitting: Orthopedic Surgery

## 2016-11-22 NOTE — Telephone Encounter (Signed)
Pt daughter requesting another paid of compression socks for pt.  [phone] Lelon Frohlich

## 2016-11-23 NOTE — Telephone Encounter (Signed)
Pt daughter requesting another paid of compression socks for pt

## 2016-11-24 NOTE — Telephone Encounter (Signed)
Called and lm on vm to advise that we can write an order for compression socks for [facility] for her. Advised to call back and let me know if she would like to have the rx mailed to the home or if she would like to come to the office and pick up

## 2016-11-25 NOTE — Telephone Encounter (Signed)
Nees to be referred to Dr. Sharol Given, I'm not sure which he ordered for her previously, I do not recall having seen her. jen

## 2016-11-28 DIAGNOSIS — N189 Chronic kidney disease, unspecified: Secondary | ICD-10-CM | POA: Diagnosis not present

## 2016-11-28 DIAGNOSIS — I509 Heart failure, unspecified: Secondary | ICD-10-CM | POA: Diagnosis not present

## 2016-11-28 DIAGNOSIS — F0390 Unspecified dementia without behavioral disturbance: Secondary | ICD-10-CM | POA: Diagnosis not present

## 2016-11-28 DIAGNOSIS — M1A9XX0 Chronic gout, unspecified, without tophus (tophi): Secondary | ICD-10-CM | POA: Diagnosis not present

## 2016-12-06 DIAGNOSIS — F321 Major depressive disorder, single episode, moderate: Secondary | ICD-10-CM | POA: Diagnosis not present

## 2016-12-06 DIAGNOSIS — G309 Alzheimer's disease, unspecified: Secondary | ICD-10-CM | POA: Diagnosis not present

## 2016-12-06 DIAGNOSIS — F0280 Dementia in other diseases classified elsewhere without behavioral disturbance: Secondary | ICD-10-CM | POA: Diagnosis not present

## 2017-01-18 DIAGNOSIS — S0093XA Contusion of unspecified part of head, initial encounter: Secondary | ICD-10-CM | POA: Diagnosis not present

## 2017-01-18 DIAGNOSIS — S0181XA Laceration without foreign body of other part of head, initial encounter: Secondary | ICD-10-CM | POA: Diagnosis not present

## 2017-01-19 DIAGNOSIS — N189 Chronic kidney disease, unspecified: Secondary | ICD-10-CM | POA: Diagnosis not present

## 2017-01-20 DIAGNOSIS — M79675 Pain in left toe(s): Secondary | ICD-10-CM | POA: Diagnosis not present

## 2017-01-20 DIAGNOSIS — B351 Tinea unguium: Secondary | ICD-10-CM | POA: Diagnosis not present

## 2017-01-20 DIAGNOSIS — M79674 Pain in right toe(s): Secondary | ICD-10-CM | POA: Diagnosis not present

## 2017-02-01 DIAGNOSIS — F321 Major depressive disorder, single episode, moderate: Secondary | ICD-10-CM | POA: Diagnosis not present

## 2017-02-01 DIAGNOSIS — G309 Alzheimer's disease, unspecified: Secondary | ICD-10-CM | POA: Diagnosis not present

## 2017-02-01 DIAGNOSIS — F0280 Dementia in other diseases classified elsewhere without behavioral disturbance: Secondary | ICD-10-CM | POA: Diagnosis not present

## 2017-02-02 DIAGNOSIS — R770 Abnormality of albumin: Secondary | ICD-10-CM | POA: Diagnosis not present

## 2017-03-10 DIAGNOSIS — H353132 Nonexudative age-related macular degeneration, bilateral, intermediate dry stage: Secondary | ICD-10-CM | POA: Diagnosis not present

## 2017-03-16 DIAGNOSIS — Z79899 Other long term (current) drug therapy: Secondary | ICD-10-CM | POA: Diagnosis not present

## 2017-04-05 DIAGNOSIS — F0280 Dementia in other diseases classified elsewhere without behavioral disturbance: Secondary | ICD-10-CM | POA: Diagnosis not present

## 2017-04-05 DIAGNOSIS — F321 Major depressive disorder, single episode, moderate: Secondary | ICD-10-CM | POA: Diagnosis not present

## 2017-04-05 DIAGNOSIS — G309 Alzheimer's disease, unspecified: Secondary | ICD-10-CM | POA: Diagnosis not present

## 2017-05-20 DIAGNOSIS — N189 Chronic kidney disease, unspecified: Secondary | ICD-10-CM | POA: Diagnosis not present

## 2017-05-31 DIAGNOSIS — G309 Alzheimer's disease, unspecified: Secondary | ICD-10-CM | POA: Diagnosis not present

## 2017-05-31 DIAGNOSIS — F321 Major depressive disorder, single episode, moderate: Secondary | ICD-10-CM | POA: Diagnosis not present

## 2017-05-31 DIAGNOSIS — F0280 Dementia in other diseases classified elsewhere without behavioral disturbance: Secondary | ICD-10-CM | POA: Diagnosis not present

## 2017-06-02 DIAGNOSIS — R293 Abnormal posture: Secondary | ICD-10-CM | POA: Diagnosis not present

## 2017-06-02 DIAGNOSIS — R42 Dizziness and giddiness: Secondary | ICD-10-CM | POA: Diagnosis not present

## 2017-06-02 DIAGNOSIS — I509 Heart failure, unspecified: Secondary | ICD-10-CM | POA: Diagnosis not present

## 2017-06-02 DIAGNOSIS — I10 Essential (primary) hypertension: Secondary | ICD-10-CM | POA: Diagnosis not present

## 2017-06-02 DIAGNOSIS — M6281 Muscle weakness (generalized): Secondary | ICD-10-CM | POA: Diagnosis not present

## 2017-06-02 DIAGNOSIS — R2689 Other abnormalities of gait and mobility: Secondary | ICD-10-CM | POA: Diagnosis not present

## 2017-06-02 DIAGNOSIS — G629 Polyneuropathy, unspecified: Secondary | ICD-10-CM | POA: Diagnosis not present

## 2017-06-02 DIAGNOSIS — M1990 Unspecified osteoarthritis, unspecified site: Secondary | ICD-10-CM | POA: Diagnosis not present

## 2017-06-02 DIAGNOSIS — M25561 Pain in right knee: Secondary | ICD-10-CM | POA: Diagnosis not present

## 2017-06-02 DIAGNOSIS — I69998 Other sequelae following unspecified cerebrovascular disease: Secondary | ICD-10-CM | POA: Diagnosis not present

## 2017-06-02 DIAGNOSIS — M8589 Other specified disorders of bone density and structure, multiple sites: Secondary | ICD-10-CM | POA: Diagnosis not present

## 2017-06-02 DIAGNOSIS — M25562 Pain in left knee: Secondary | ICD-10-CM | POA: Diagnosis not present

## 2017-06-02 DIAGNOSIS — F0390 Unspecified dementia without behavioral disturbance: Secondary | ICD-10-CM | POA: Diagnosis not present

## 2017-06-02 DIAGNOSIS — F329 Major depressive disorder, single episode, unspecified: Secondary | ICD-10-CM | POA: Diagnosis not present

## 2017-06-03 DIAGNOSIS — I10 Essential (primary) hypertension: Secondary | ICD-10-CM | POA: Diagnosis not present

## 2017-06-03 DIAGNOSIS — M6281 Muscle weakness (generalized): Secondary | ICD-10-CM | POA: Diagnosis not present

## 2017-06-03 DIAGNOSIS — I509 Heart failure, unspecified: Secondary | ICD-10-CM | POA: Diagnosis not present

## 2017-06-03 DIAGNOSIS — M25562 Pain in left knee: Secondary | ICD-10-CM | POA: Diagnosis not present

## 2017-06-03 DIAGNOSIS — F329 Major depressive disorder, single episode, unspecified: Secondary | ICD-10-CM | POA: Diagnosis not present

## 2017-06-03 DIAGNOSIS — F0390 Unspecified dementia without behavioral disturbance: Secondary | ICD-10-CM | POA: Diagnosis not present

## 2017-06-06 DIAGNOSIS — F0390 Unspecified dementia without behavioral disturbance: Secondary | ICD-10-CM | POA: Diagnosis not present

## 2017-06-06 DIAGNOSIS — F329 Major depressive disorder, single episode, unspecified: Secondary | ICD-10-CM | POA: Diagnosis not present

## 2017-06-06 DIAGNOSIS — I10 Essential (primary) hypertension: Secondary | ICD-10-CM | POA: Diagnosis not present

## 2017-06-06 DIAGNOSIS — M6281 Muscle weakness (generalized): Secondary | ICD-10-CM | POA: Diagnosis not present

## 2017-06-06 DIAGNOSIS — I509 Heart failure, unspecified: Secondary | ICD-10-CM | POA: Diagnosis not present

## 2017-06-06 DIAGNOSIS — M25562 Pain in left knee: Secondary | ICD-10-CM | POA: Diagnosis not present

## 2017-06-07 DIAGNOSIS — I509 Heart failure, unspecified: Secondary | ICD-10-CM | POA: Diagnosis not present

## 2017-06-07 DIAGNOSIS — M25562 Pain in left knee: Secondary | ICD-10-CM | POA: Diagnosis not present

## 2017-06-07 DIAGNOSIS — F0390 Unspecified dementia without behavioral disturbance: Secondary | ICD-10-CM | POA: Diagnosis not present

## 2017-06-07 DIAGNOSIS — M6281 Muscle weakness (generalized): Secondary | ICD-10-CM | POA: Diagnosis not present

## 2017-06-07 DIAGNOSIS — F329 Major depressive disorder, single episode, unspecified: Secondary | ICD-10-CM | POA: Diagnosis not present

## 2017-06-07 DIAGNOSIS — I10 Essential (primary) hypertension: Secondary | ICD-10-CM | POA: Diagnosis not present

## 2017-06-08 DIAGNOSIS — F329 Major depressive disorder, single episode, unspecified: Secondary | ICD-10-CM | POA: Diagnosis not present

## 2017-06-08 DIAGNOSIS — M6281 Muscle weakness (generalized): Secondary | ICD-10-CM | POA: Diagnosis not present

## 2017-06-08 DIAGNOSIS — M25562 Pain in left knee: Secondary | ICD-10-CM | POA: Diagnosis not present

## 2017-06-08 DIAGNOSIS — I10 Essential (primary) hypertension: Secondary | ICD-10-CM | POA: Diagnosis not present

## 2017-06-08 DIAGNOSIS — I509 Heart failure, unspecified: Secondary | ICD-10-CM | POA: Diagnosis not present

## 2017-06-08 DIAGNOSIS — F0390 Unspecified dementia without behavioral disturbance: Secondary | ICD-10-CM | POA: Diagnosis not present

## 2017-06-09 DIAGNOSIS — I509 Heart failure, unspecified: Secondary | ICD-10-CM | POA: Diagnosis not present

## 2017-06-09 DIAGNOSIS — F0390 Unspecified dementia without behavioral disturbance: Secondary | ICD-10-CM | POA: Diagnosis not present

## 2017-06-09 DIAGNOSIS — M25562 Pain in left knee: Secondary | ICD-10-CM | POA: Diagnosis not present

## 2017-06-09 DIAGNOSIS — M6281 Muscle weakness (generalized): Secondary | ICD-10-CM | POA: Diagnosis not present

## 2017-06-09 DIAGNOSIS — I10 Essential (primary) hypertension: Secondary | ICD-10-CM | POA: Diagnosis not present

## 2017-06-09 DIAGNOSIS — F329 Major depressive disorder, single episode, unspecified: Secondary | ICD-10-CM | POA: Diagnosis not present

## 2017-06-10 DIAGNOSIS — I509 Heart failure, unspecified: Secondary | ICD-10-CM | POA: Diagnosis not present

## 2017-06-10 DIAGNOSIS — I10 Essential (primary) hypertension: Secondary | ICD-10-CM | POA: Diagnosis not present

## 2017-06-10 DIAGNOSIS — F0390 Unspecified dementia without behavioral disturbance: Secondary | ICD-10-CM | POA: Diagnosis not present

## 2017-06-10 DIAGNOSIS — F329 Major depressive disorder, single episode, unspecified: Secondary | ICD-10-CM | POA: Diagnosis not present

## 2017-06-10 DIAGNOSIS — M25562 Pain in left knee: Secondary | ICD-10-CM | POA: Diagnosis not present

## 2017-06-10 DIAGNOSIS — M6281 Muscle weakness (generalized): Secondary | ICD-10-CM | POA: Diagnosis not present

## 2017-06-13 DIAGNOSIS — M6281 Muscle weakness (generalized): Secondary | ICD-10-CM | POA: Diagnosis not present

## 2017-06-13 DIAGNOSIS — I509 Heart failure, unspecified: Secondary | ICD-10-CM | POA: Diagnosis not present

## 2017-06-13 DIAGNOSIS — F0390 Unspecified dementia without behavioral disturbance: Secondary | ICD-10-CM | POA: Diagnosis not present

## 2017-06-13 DIAGNOSIS — F329 Major depressive disorder, single episode, unspecified: Secondary | ICD-10-CM | POA: Diagnosis not present

## 2017-06-13 DIAGNOSIS — I10 Essential (primary) hypertension: Secondary | ICD-10-CM | POA: Diagnosis not present

## 2017-06-13 DIAGNOSIS — M25562 Pain in left knee: Secondary | ICD-10-CM | POA: Diagnosis not present

## 2017-06-14 DIAGNOSIS — I509 Heart failure, unspecified: Secondary | ICD-10-CM | POA: Diagnosis not present

## 2017-06-14 DIAGNOSIS — F0390 Unspecified dementia without behavioral disturbance: Secondary | ICD-10-CM | POA: Diagnosis not present

## 2017-06-14 DIAGNOSIS — F329 Major depressive disorder, single episode, unspecified: Secondary | ICD-10-CM | POA: Diagnosis not present

## 2017-06-14 DIAGNOSIS — I10 Essential (primary) hypertension: Secondary | ICD-10-CM | POA: Diagnosis not present

## 2017-06-14 DIAGNOSIS — M6281 Muscle weakness (generalized): Secondary | ICD-10-CM | POA: Diagnosis not present

## 2017-06-14 DIAGNOSIS — M25562 Pain in left knee: Secondary | ICD-10-CM | POA: Diagnosis not present

## 2017-06-15 DIAGNOSIS — M6281 Muscle weakness (generalized): Secondary | ICD-10-CM | POA: Diagnosis not present

## 2017-06-15 DIAGNOSIS — K219 Gastro-esophageal reflux disease without esophagitis: Secondary | ICD-10-CM | POA: Diagnosis not present

## 2017-06-15 DIAGNOSIS — F329 Major depressive disorder, single episode, unspecified: Secondary | ICD-10-CM | POA: Diagnosis not present

## 2017-06-15 DIAGNOSIS — I509 Heart failure, unspecified: Secondary | ICD-10-CM | POA: Diagnosis not present

## 2017-06-15 DIAGNOSIS — Z6821 Body mass index (BMI) 21.0-21.9, adult: Secondary | ICD-10-CM | POA: Diagnosis not present

## 2017-06-15 DIAGNOSIS — F0390 Unspecified dementia without behavioral disturbance: Secondary | ICD-10-CM | POA: Diagnosis not present

## 2017-06-15 DIAGNOSIS — M25562 Pain in left knee: Secondary | ICD-10-CM | POA: Diagnosis not present

## 2017-06-15 DIAGNOSIS — I10 Essential (primary) hypertension: Secondary | ICD-10-CM | POA: Diagnosis not present

## 2017-06-16 DIAGNOSIS — M25562 Pain in left knee: Secondary | ICD-10-CM | POA: Diagnosis not present

## 2017-06-16 DIAGNOSIS — F0390 Unspecified dementia without behavioral disturbance: Secondary | ICD-10-CM | POA: Diagnosis not present

## 2017-06-16 DIAGNOSIS — M6281 Muscle weakness (generalized): Secondary | ICD-10-CM | POA: Diagnosis not present

## 2017-06-16 DIAGNOSIS — F329 Major depressive disorder, single episode, unspecified: Secondary | ICD-10-CM | POA: Diagnosis not present

## 2017-06-16 DIAGNOSIS — I509 Heart failure, unspecified: Secondary | ICD-10-CM | POA: Diagnosis not present

## 2017-06-16 DIAGNOSIS — I10 Essential (primary) hypertension: Secondary | ICD-10-CM | POA: Diagnosis not present

## 2017-06-17 DIAGNOSIS — I509 Heart failure, unspecified: Secondary | ICD-10-CM | POA: Diagnosis not present

## 2017-06-17 DIAGNOSIS — M6281 Muscle weakness (generalized): Secondary | ICD-10-CM | POA: Diagnosis not present

## 2017-06-17 DIAGNOSIS — F329 Major depressive disorder, single episode, unspecified: Secondary | ICD-10-CM | POA: Diagnosis not present

## 2017-06-17 DIAGNOSIS — I10 Essential (primary) hypertension: Secondary | ICD-10-CM | POA: Diagnosis not present

## 2017-06-17 DIAGNOSIS — M25562 Pain in left knee: Secondary | ICD-10-CM | POA: Diagnosis not present

## 2017-06-17 DIAGNOSIS — F0390 Unspecified dementia without behavioral disturbance: Secondary | ICD-10-CM | POA: Diagnosis not present

## 2017-06-20 DIAGNOSIS — M25562 Pain in left knee: Secondary | ICD-10-CM | POA: Diagnosis not present

## 2017-06-20 DIAGNOSIS — F329 Major depressive disorder, single episode, unspecified: Secondary | ICD-10-CM | POA: Diagnosis not present

## 2017-06-20 DIAGNOSIS — I10 Essential (primary) hypertension: Secondary | ICD-10-CM | POA: Diagnosis not present

## 2017-06-20 DIAGNOSIS — I509 Heart failure, unspecified: Secondary | ICD-10-CM | POA: Diagnosis not present

## 2017-06-20 DIAGNOSIS — M6281 Muscle weakness (generalized): Secondary | ICD-10-CM | POA: Diagnosis not present

## 2017-06-20 DIAGNOSIS — F0390 Unspecified dementia without behavioral disturbance: Secondary | ICD-10-CM | POA: Diagnosis not present

## 2017-06-21 DIAGNOSIS — I509 Heart failure, unspecified: Secondary | ICD-10-CM | POA: Diagnosis not present

## 2017-06-21 DIAGNOSIS — M6281 Muscle weakness (generalized): Secondary | ICD-10-CM | POA: Diagnosis not present

## 2017-06-21 DIAGNOSIS — I10 Essential (primary) hypertension: Secondary | ICD-10-CM | POA: Diagnosis not present

## 2017-06-21 DIAGNOSIS — F0390 Unspecified dementia without behavioral disturbance: Secondary | ICD-10-CM | POA: Diagnosis not present

## 2017-06-21 DIAGNOSIS — M25562 Pain in left knee: Secondary | ICD-10-CM | POA: Diagnosis not present

## 2017-06-21 DIAGNOSIS — F329 Major depressive disorder, single episode, unspecified: Secondary | ICD-10-CM | POA: Diagnosis not present

## 2017-06-22 DIAGNOSIS — F329 Major depressive disorder, single episode, unspecified: Secondary | ICD-10-CM | POA: Diagnosis not present

## 2017-06-22 DIAGNOSIS — I10 Essential (primary) hypertension: Secondary | ICD-10-CM | POA: Diagnosis not present

## 2017-06-22 DIAGNOSIS — F0390 Unspecified dementia without behavioral disturbance: Secondary | ICD-10-CM | POA: Diagnosis not present

## 2017-06-22 DIAGNOSIS — I509 Heart failure, unspecified: Secondary | ICD-10-CM | POA: Diagnosis not present

## 2017-06-22 DIAGNOSIS — M25562 Pain in left knee: Secondary | ICD-10-CM | POA: Diagnosis not present

## 2017-06-22 DIAGNOSIS — M6281 Muscle weakness (generalized): Secondary | ICD-10-CM | POA: Diagnosis not present

## 2017-06-23 DIAGNOSIS — I10 Essential (primary) hypertension: Secondary | ICD-10-CM | POA: Diagnosis not present

## 2017-06-23 DIAGNOSIS — M25562 Pain in left knee: Secondary | ICD-10-CM | POA: Diagnosis not present

## 2017-06-23 DIAGNOSIS — M6281 Muscle weakness (generalized): Secondary | ICD-10-CM | POA: Diagnosis not present

## 2017-06-23 DIAGNOSIS — I509 Heart failure, unspecified: Secondary | ICD-10-CM | POA: Diagnosis not present

## 2017-06-23 DIAGNOSIS — F329 Major depressive disorder, single episode, unspecified: Secondary | ICD-10-CM | POA: Diagnosis not present

## 2017-06-23 DIAGNOSIS — F0390 Unspecified dementia without behavioral disturbance: Secondary | ICD-10-CM | POA: Diagnosis not present

## 2017-06-24 DIAGNOSIS — M6281 Muscle weakness (generalized): Secondary | ICD-10-CM | POA: Diagnosis not present

## 2017-06-24 DIAGNOSIS — I10 Essential (primary) hypertension: Secondary | ICD-10-CM | POA: Diagnosis not present

## 2017-06-24 DIAGNOSIS — I509 Heart failure, unspecified: Secondary | ICD-10-CM | POA: Diagnosis not present

## 2017-06-24 DIAGNOSIS — F329 Major depressive disorder, single episode, unspecified: Secondary | ICD-10-CM | POA: Diagnosis not present

## 2017-06-24 DIAGNOSIS — F0390 Unspecified dementia without behavioral disturbance: Secondary | ICD-10-CM | POA: Diagnosis not present

## 2017-06-24 DIAGNOSIS — M25562 Pain in left knee: Secondary | ICD-10-CM | POA: Diagnosis not present

## 2017-06-27 DIAGNOSIS — M1990 Unspecified osteoarthritis, unspecified site: Secondary | ICD-10-CM | POA: Diagnosis not present

## 2017-06-27 DIAGNOSIS — F0390 Unspecified dementia without behavioral disturbance: Secondary | ICD-10-CM | POA: Diagnosis not present

## 2017-06-27 DIAGNOSIS — R42 Dizziness and giddiness: Secondary | ICD-10-CM | POA: Diagnosis not present

## 2017-06-27 DIAGNOSIS — I10 Essential (primary) hypertension: Secondary | ICD-10-CM | POA: Diagnosis not present

## 2017-06-27 DIAGNOSIS — R2689 Other abnormalities of gait and mobility: Secondary | ICD-10-CM | POA: Diagnosis not present

## 2017-06-27 DIAGNOSIS — R293 Abnormal posture: Secondary | ICD-10-CM | POA: Diagnosis not present

## 2017-06-27 DIAGNOSIS — I69998 Other sequelae following unspecified cerebrovascular disease: Secondary | ICD-10-CM | POA: Diagnosis not present

## 2017-06-27 DIAGNOSIS — M25561 Pain in right knee: Secondary | ICD-10-CM | POA: Diagnosis not present

## 2017-06-27 DIAGNOSIS — R2681 Unsteadiness on feet: Secondary | ICD-10-CM | POA: Diagnosis not present

## 2017-06-27 DIAGNOSIS — G629 Polyneuropathy, unspecified: Secondary | ICD-10-CM | POA: Diagnosis not present

## 2017-06-27 DIAGNOSIS — M25562 Pain in left knee: Secondary | ICD-10-CM | POA: Diagnosis not present

## 2017-06-27 DIAGNOSIS — M8589 Other specified disorders of bone density and structure, multiple sites: Secondary | ICD-10-CM | POA: Diagnosis not present

## 2017-06-27 DIAGNOSIS — F329 Major depressive disorder, single episode, unspecified: Secondary | ICD-10-CM | POA: Diagnosis not present

## 2017-06-27 DIAGNOSIS — M6281 Muscle weakness (generalized): Secondary | ICD-10-CM | POA: Diagnosis not present

## 2017-06-27 DIAGNOSIS — I509 Heart failure, unspecified: Secondary | ICD-10-CM | POA: Diagnosis not present

## 2017-06-28 DIAGNOSIS — I10 Essential (primary) hypertension: Secondary | ICD-10-CM | POA: Diagnosis not present

## 2017-06-28 DIAGNOSIS — F0390 Unspecified dementia without behavioral disturbance: Secondary | ICD-10-CM | POA: Diagnosis not present

## 2017-06-28 DIAGNOSIS — M6281 Muscle weakness (generalized): Secondary | ICD-10-CM | POA: Diagnosis not present

## 2017-06-28 DIAGNOSIS — F329 Major depressive disorder, single episode, unspecified: Secondary | ICD-10-CM | POA: Diagnosis not present

## 2017-06-28 DIAGNOSIS — M25562 Pain in left knee: Secondary | ICD-10-CM | POA: Diagnosis not present

## 2017-06-28 DIAGNOSIS — I509 Heart failure, unspecified: Secondary | ICD-10-CM | POA: Diagnosis not present

## 2017-06-29 DIAGNOSIS — M25562 Pain in left knee: Secondary | ICD-10-CM | POA: Diagnosis not present

## 2017-06-29 DIAGNOSIS — M6281 Muscle weakness (generalized): Secondary | ICD-10-CM | POA: Diagnosis not present

## 2017-06-29 DIAGNOSIS — I10 Essential (primary) hypertension: Secondary | ICD-10-CM | POA: Diagnosis not present

## 2017-06-29 DIAGNOSIS — F329 Major depressive disorder, single episode, unspecified: Secondary | ICD-10-CM | POA: Diagnosis not present

## 2017-06-29 DIAGNOSIS — F0390 Unspecified dementia without behavioral disturbance: Secondary | ICD-10-CM | POA: Diagnosis not present

## 2017-06-29 DIAGNOSIS — I509 Heart failure, unspecified: Secondary | ICD-10-CM | POA: Diagnosis not present

## 2017-06-30 DIAGNOSIS — F0390 Unspecified dementia without behavioral disturbance: Secondary | ICD-10-CM | POA: Diagnosis not present

## 2017-06-30 DIAGNOSIS — F329 Major depressive disorder, single episode, unspecified: Secondary | ICD-10-CM | POA: Diagnosis not present

## 2017-06-30 DIAGNOSIS — M25562 Pain in left knee: Secondary | ICD-10-CM | POA: Diagnosis not present

## 2017-06-30 DIAGNOSIS — I10 Essential (primary) hypertension: Secondary | ICD-10-CM | POA: Diagnosis not present

## 2017-06-30 DIAGNOSIS — M6281 Muscle weakness (generalized): Secondary | ICD-10-CM | POA: Diagnosis not present

## 2017-06-30 DIAGNOSIS — I509 Heart failure, unspecified: Secondary | ICD-10-CM | POA: Diagnosis not present

## 2017-07-01 DIAGNOSIS — I10 Essential (primary) hypertension: Secondary | ICD-10-CM | POA: Diagnosis not present

## 2017-07-01 DIAGNOSIS — M25562 Pain in left knee: Secondary | ICD-10-CM | POA: Diagnosis not present

## 2017-07-01 DIAGNOSIS — I509 Heart failure, unspecified: Secondary | ICD-10-CM | POA: Diagnosis not present

## 2017-07-01 DIAGNOSIS — F329 Major depressive disorder, single episode, unspecified: Secondary | ICD-10-CM | POA: Diagnosis not present

## 2017-07-01 DIAGNOSIS — M6281 Muscle weakness (generalized): Secondary | ICD-10-CM | POA: Diagnosis not present

## 2017-07-01 DIAGNOSIS — F0390 Unspecified dementia without behavioral disturbance: Secondary | ICD-10-CM | POA: Diagnosis not present

## 2017-07-04 DIAGNOSIS — F329 Major depressive disorder, single episode, unspecified: Secondary | ICD-10-CM | POA: Diagnosis not present

## 2017-07-04 DIAGNOSIS — M25562 Pain in left knee: Secondary | ICD-10-CM | POA: Diagnosis not present

## 2017-07-04 DIAGNOSIS — I509 Heart failure, unspecified: Secondary | ICD-10-CM | POA: Diagnosis not present

## 2017-07-04 DIAGNOSIS — M6281 Muscle weakness (generalized): Secondary | ICD-10-CM | POA: Diagnosis not present

## 2017-07-04 DIAGNOSIS — I10 Essential (primary) hypertension: Secondary | ICD-10-CM | POA: Diagnosis not present

## 2017-07-04 DIAGNOSIS — F0390 Unspecified dementia without behavioral disturbance: Secondary | ICD-10-CM | POA: Diagnosis not present

## 2017-07-07 DIAGNOSIS — F0390 Unspecified dementia without behavioral disturbance: Secondary | ICD-10-CM | POA: Diagnosis not present

## 2017-07-07 DIAGNOSIS — M25562 Pain in left knee: Secondary | ICD-10-CM | POA: Diagnosis not present

## 2017-07-07 DIAGNOSIS — I10 Essential (primary) hypertension: Secondary | ICD-10-CM | POA: Diagnosis not present

## 2017-07-07 DIAGNOSIS — I509 Heart failure, unspecified: Secondary | ICD-10-CM | POA: Diagnosis not present

## 2017-07-07 DIAGNOSIS — F329 Major depressive disorder, single episode, unspecified: Secondary | ICD-10-CM | POA: Diagnosis not present

## 2017-07-07 DIAGNOSIS — M6281 Muscle weakness (generalized): Secondary | ICD-10-CM | POA: Diagnosis not present

## 2017-07-08 DIAGNOSIS — M25562 Pain in left knee: Secondary | ICD-10-CM | POA: Diagnosis not present

## 2017-07-08 DIAGNOSIS — I10 Essential (primary) hypertension: Secondary | ICD-10-CM | POA: Diagnosis not present

## 2017-07-08 DIAGNOSIS — F329 Major depressive disorder, single episode, unspecified: Secondary | ICD-10-CM | POA: Diagnosis not present

## 2017-07-08 DIAGNOSIS — M6281 Muscle weakness (generalized): Secondary | ICD-10-CM | POA: Diagnosis not present

## 2017-07-08 DIAGNOSIS — F0390 Unspecified dementia without behavioral disturbance: Secondary | ICD-10-CM | POA: Diagnosis not present

## 2017-07-08 DIAGNOSIS — I509 Heart failure, unspecified: Secondary | ICD-10-CM | POA: Diagnosis not present

## 2017-07-11 DIAGNOSIS — I10 Essential (primary) hypertension: Secondary | ICD-10-CM | POA: Diagnosis not present

## 2017-07-11 DIAGNOSIS — M6281 Muscle weakness (generalized): Secondary | ICD-10-CM | POA: Diagnosis not present

## 2017-07-11 DIAGNOSIS — I509 Heart failure, unspecified: Secondary | ICD-10-CM | POA: Diagnosis not present

## 2017-07-11 DIAGNOSIS — F0390 Unspecified dementia without behavioral disturbance: Secondary | ICD-10-CM | POA: Diagnosis not present

## 2017-07-11 DIAGNOSIS — F329 Major depressive disorder, single episode, unspecified: Secondary | ICD-10-CM | POA: Diagnosis not present

## 2017-07-11 DIAGNOSIS — M25562 Pain in left knee: Secondary | ICD-10-CM | POA: Diagnosis not present

## 2017-07-12 DIAGNOSIS — I10 Essential (primary) hypertension: Secondary | ICD-10-CM | POA: Diagnosis not present

## 2017-07-12 DIAGNOSIS — M25562 Pain in left knee: Secondary | ICD-10-CM | POA: Diagnosis not present

## 2017-07-12 DIAGNOSIS — F0390 Unspecified dementia without behavioral disturbance: Secondary | ICD-10-CM | POA: Diagnosis not present

## 2017-07-12 DIAGNOSIS — I509 Heart failure, unspecified: Secondary | ICD-10-CM | POA: Diagnosis not present

## 2017-07-12 DIAGNOSIS — M6281 Muscle weakness (generalized): Secondary | ICD-10-CM | POA: Diagnosis not present

## 2017-07-12 DIAGNOSIS — F329 Major depressive disorder, single episode, unspecified: Secondary | ICD-10-CM | POA: Diagnosis not present

## 2017-07-13 DIAGNOSIS — I10 Essential (primary) hypertension: Secondary | ICD-10-CM | POA: Diagnosis not present

## 2017-07-13 DIAGNOSIS — I509 Heart failure, unspecified: Secondary | ICD-10-CM | POA: Diagnosis not present

## 2017-07-13 DIAGNOSIS — M6281 Muscle weakness (generalized): Secondary | ICD-10-CM | POA: Diagnosis not present

## 2017-07-13 DIAGNOSIS — M25562 Pain in left knee: Secondary | ICD-10-CM | POA: Diagnosis not present

## 2017-07-13 DIAGNOSIS — F0390 Unspecified dementia without behavioral disturbance: Secondary | ICD-10-CM | POA: Diagnosis not present

## 2017-07-13 DIAGNOSIS — F329 Major depressive disorder, single episode, unspecified: Secondary | ICD-10-CM | POA: Diagnosis not present

## 2017-07-18 DIAGNOSIS — F0390 Unspecified dementia without behavioral disturbance: Secondary | ICD-10-CM | POA: Diagnosis not present

## 2017-07-18 DIAGNOSIS — M25562 Pain in left knee: Secondary | ICD-10-CM | POA: Diagnosis not present

## 2017-07-18 DIAGNOSIS — M6281 Muscle weakness (generalized): Secondary | ICD-10-CM | POA: Diagnosis not present

## 2017-07-18 DIAGNOSIS — I10 Essential (primary) hypertension: Secondary | ICD-10-CM | POA: Diagnosis not present

## 2017-07-18 DIAGNOSIS — I509 Heart failure, unspecified: Secondary | ICD-10-CM | POA: Diagnosis not present

## 2017-07-18 DIAGNOSIS — F329 Major depressive disorder, single episode, unspecified: Secondary | ICD-10-CM | POA: Diagnosis not present

## 2017-07-20 DIAGNOSIS — I509 Heart failure, unspecified: Secondary | ICD-10-CM | POA: Diagnosis not present

## 2017-07-20 DIAGNOSIS — I10 Essential (primary) hypertension: Secondary | ICD-10-CM | POA: Diagnosis not present

## 2017-07-20 DIAGNOSIS — M6281 Muscle weakness (generalized): Secondary | ICD-10-CM | POA: Diagnosis not present

## 2017-07-20 DIAGNOSIS — F329 Major depressive disorder, single episode, unspecified: Secondary | ICD-10-CM | POA: Diagnosis not present

## 2017-07-20 DIAGNOSIS — M25562 Pain in left knee: Secondary | ICD-10-CM | POA: Diagnosis not present

## 2017-07-20 DIAGNOSIS — F0390 Unspecified dementia without behavioral disturbance: Secondary | ICD-10-CM | POA: Diagnosis not present

## 2017-07-21 DIAGNOSIS — I10 Essential (primary) hypertension: Secondary | ICD-10-CM | POA: Diagnosis not present

## 2017-07-21 DIAGNOSIS — I509 Heart failure, unspecified: Secondary | ICD-10-CM | POA: Diagnosis not present

## 2017-07-21 DIAGNOSIS — F329 Major depressive disorder, single episode, unspecified: Secondary | ICD-10-CM | POA: Diagnosis not present

## 2017-07-21 DIAGNOSIS — M6281 Muscle weakness (generalized): Secondary | ICD-10-CM | POA: Diagnosis not present

## 2017-07-21 DIAGNOSIS — M25562 Pain in left knee: Secondary | ICD-10-CM | POA: Diagnosis not present

## 2017-07-21 DIAGNOSIS — F0390 Unspecified dementia without behavioral disturbance: Secondary | ICD-10-CM | POA: Diagnosis not present

## 2017-07-25 DIAGNOSIS — M25562 Pain in left knee: Secondary | ICD-10-CM | POA: Diagnosis not present

## 2017-07-25 DIAGNOSIS — F329 Major depressive disorder, single episode, unspecified: Secondary | ICD-10-CM | POA: Diagnosis not present

## 2017-07-25 DIAGNOSIS — M6281 Muscle weakness (generalized): Secondary | ICD-10-CM | POA: Diagnosis not present

## 2017-07-25 DIAGNOSIS — I10 Essential (primary) hypertension: Secondary | ICD-10-CM | POA: Diagnosis not present

## 2017-07-25 DIAGNOSIS — F0390 Unspecified dementia without behavioral disturbance: Secondary | ICD-10-CM | POA: Diagnosis not present

## 2017-07-25 DIAGNOSIS — I509 Heart failure, unspecified: Secondary | ICD-10-CM | POA: Diagnosis not present

## 2017-07-27 DIAGNOSIS — M79675 Pain in left toe(s): Secondary | ICD-10-CM | POA: Diagnosis not present

## 2017-07-27 DIAGNOSIS — I509 Heart failure, unspecified: Secondary | ICD-10-CM | POA: Diagnosis not present

## 2017-07-27 DIAGNOSIS — M25562 Pain in left knee: Secondary | ICD-10-CM | POA: Diagnosis not present

## 2017-07-27 DIAGNOSIS — B351 Tinea unguium: Secondary | ICD-10-CM | POA: Diagnosis not present

## 2017-07-27 DIAGNOSIS — F0390 Unspecified dementia without behavioral disturbance: Secondary | ICD-10-CM | POA: Diagnosis not present

## 2017-07-27 DIAGNOSIS — I10 Essential (primary) hypertension: Secondary | ICD-10-CM | POA: Diagnosis not present

## 2017-07-27 DIAGNOSIS — M6281 Muscle weakness (generalized): Secondary | ICD-10-CM | POA: Diagnosis not present

## 2017-07-27 DIAGNOSIS — F329 Major depressive disorder, single episode, unspecified: Secondary | ICD-10-CM | POA: Diagnosis not present

## 2017-07-29 DIAGNOSIS — G629 Polyneuropathy, unspecified: Secondary | ICD-10-CM | POA: Diagnosis not present

## 2017-07-29 DIAGNOSIS — R42 Dizziness and giddiness: Secondary | ICD-10-CM | POA: Diagnosis not present

## 2017-07-29 DIAGNOSIS — R293 Abnormal posture: Secondary | ICD-10-CM | POA: Diagnosis not present

## 2017-07-29 DIAGNOSIS — I509 Heart failure, unspecified: Secondary | ICD-10-CM | POA: Diagnosis not present

## 2017-07-29 DIAGNOSIS — M25562 Pain in left knee: Secondary | ICD-10-CM | POA: Diagnosis not present

## 2017-07-29 DIAGNOSIS — M6281 Muscle weakness (generalized): Secondary | ICD-10-CM | POA: Diagnosis not present

## 2017-07-29 DIAGNOSIS — M8589 Other specified disorders of bone density and structure, multiple sites: Secondary | ICD-10-CM | POA: Diagnosis not present

## 2017-07-29 DIAGNOSIS — M1990 Unspecified osteoarthritis, unspecified site: Secondary | ICD-10-CM | POA: Diagnosis not present

## 2017-07-29 DIAGNOSIS — R2689 Other abnormalities of gait and mobility: Secondary | ICD-10-CM | POA: Diagnosis not present

## 2017-07-29 DIAGNOSIS — F0390 Unspecified dementia without behavioral disturbance: Secondary | ICD-10-CM | POA: Diagnosis not present

## 2017-07-29 DIAGNOSIS — R2681 Unsteadiness on feet: Secondary | ICD-10-CM | POA: Diagnosis not present

## 2017-07-29 DIAGNOSIS — M25561 Pain in right knee: Secondary | ICD-10-CM | POA: Diagnosis not present

## 2017-07-29 DIAGNOSIS — I69998 Other sequelae following unspecified cerebrovascular disease: Secondary | ICD-10-CM | POA: Diagnosis not present

## 2017-07-29 DIAGNOSIS — F329 Major depressive disorder, single episode, unspecified: Secondary | ICD-10-CM | POA: Diagnosis not present

## 2017-07-29 DIAGNOSIS — I10 Essential (primary) hypertension: Secondary | ICD-10-CM | POA: Diagnosis not present

## 2017-08-01 DIAGNOSIS — J00 Acute nasopharyngitis [common cold]: Secondary | ICD-10-CM | POA: Diagnosis not present

## 2017-08-02 DIAGNOSIS — G309 Alzheimer's disease, unspecified: Secondary | ICD-10-CM | POA: Diagnosis not present

## 2017-08-02 DIAGNOSIS — F321 Major depressive disorder, single episode, moderate: Secondary | ICD-10-CM | POA: Diagnosis not present

## 2017-08-02 DIAGNOSIS — F0280 Dementia in other diseases classified elsewhere without behavioral disturbance: Secondary | ICD-10-CM | POA: Diagnosis not present

## 2017-08-13 DIAGNOSIS — M79641 Pain in right hand: Secondary | ICD-10-CM | POA: Diagnosis not present

## 2017-09-08 DIAGNOSIS — H353132 Nonexudative age-related macular degeneration, bilateral, intermediate dry stage: Secondary | ICD-10-CM | POA: Diagnosis not present

## 2017-09-13 DIAGNOSIS — F321 Major depressive disorder, single episode, moderate: Secondary | ICD-10-CM | POA: Diagnosis not present

## 2017-09-13 DIAGNOSIS — F0280 Dementia in other diseases classified elsewhere without behavioral disturbance: Secondary | ICD-10-CM | POA: Diagnosis not present

## 2017-09-19 DIAGNOSIS — Z79899 Other long term (current) drug therapy: Secondary | ICD-10-CM | POA: Diagnosis not present

## 2017-09-19 DIAGNOSIS — N189 Chronic kidney disease, unspecified: Secondary | ICD-10-CM | POA: Diagnosis not present

## 2017-09-21 DIAGNOSIS — Z139 Encounter for screening, unspecified: Secondary | ICD-10-CM | POA: Diagnosis not present

## 2017-09-21 DIAGNOSIS — F329 Major depressive disorder, single episode, unspecified: Secondary | ICD-10-CM | POA: Diagnosis not present

## 2017-09-21 DIAGNOSIS — Z0000 Encounter for general adult medical examination without abnormal findings: Secondary | ICD-10-CM | POA: Diagnosis not present

## 2017-09-21 DIAGNOSIS — Z9181 History of falling: Secondary | ICD-10-CM | POA: Diagnosis not present

## 2017-09-23 DIAGNOSIS — M6281 Muscle weakness (generalized): Secondary | ICD-10-CM | POA: Diagnosis not present

## 2017-09-23 DIAGNOSIS — I10 Essential (primary) hypertension: Secondary | ICD-10-CM | POA: Diagnosis not present

## 2017-09-23 DIAGNOSIS — N189 Chronic kidney disease, unspecified: Secondary | ICD-10-CM | POA: Diagnosis not present

## 2017-09-23 DIAGNOSIS — I509 Heart failure, unspecified: Secondary | ICD-10-CM | POA: Diagnosis not present

## 2017-10-25 DIAGNOSIS — F321 Major depressive disorder, single episode, moderate: Secondary | ICD-10-CM | POA: Diagnosis not present

## 2017-10-25 DIAGNOSIS — G309 Alzheimer's disease, unspecified: Secondary | ICD-10-CM | POA: Diagnosis not present

## 2017-10-25 DIAGNOSIS — F0280 Dementia in other diseases classified elsewhere without behavioral disturbance: Secondary | ICD-10-CM | POA: Diagnosis not present

## 2017-10-26 DIAGNOSIS — M79675 Pain in left toe(s): Secondary | ICD-10-CM | POA: Diagnosis not present

## 2017-10-26 DIAGNOSIS — B351 Tinea unguium: Secondary | ICD-10-CM | POA: Diagnosis not present

## 2017-10-26 DIAGNOSIS — M79674 Pain in right toe(s): Secondary | ICD-10-CM | POA: Diagnosis not present

## 2017-10-31 DIAGNOSIS — F330 Major depressive disorder, recurrent, mild: Secondary | ICD-10-CM | POA: Diagnosis not present

## 2017-10-31 DIAGNOSIS — I509 Heart failure, unspecified: Secondary | ICD-10-CM | POA: Diagnosis not present

## 2017-10-31 DIAGNOSIS — I10 Essential (primary) hypertension: Secondary | ICD-10-CM | POA: Diagnosis not present

## 2017-10-31 DIAGNOSIS — K219 Gastro-esophageal reflux disease without esophagitis: Secondary | ICD-10-CM | POA: Diagnosis not present

## 2017-12-02 DIAGNOSIS — K219 Gastro-esophageal reflux disease without esophagitis: Secondary | ICD-10-CM | POA: Diagnosis not present

## 2017-12-02 DIAGNOSIS — I10 Essential (primary) hypertension: Secondary | ICD-10-CM | POA: Diagnosis not present

## 2017-12-02 DIAGNOSIS — F330 Major depressive disorder, recurrent, mild: Secondary | ICD-10-CM | POA: Diagnosis not present

## 2017-12-02 DIAGNOSIS — I509 Heart failure, unspecified: Secondary | ICD-10-CM | POA: Diagnosis not present

## 2017-12-13 DIAGNOSIS — F321 Major depressive disorder, single episode, moderate: Secondary | ICD-10-CM | POA: Diagnosis not present

## 2017-12-13 DIAGNOSIS — G309 Alzheimer's disease, unspecified: Secondary | ICD-10-CM | POA: Diagnosis not present

## 2017-12-13 DIAGNOSIS — F0280 Dementia in other diseases classified elsewhere without behavioral disturbance: Secondary | ICD-10-CM | POA: Diagnosis not present

## 2018-01-11 DIAGNOSIS — B351 Tinea unguium: Secondary | ICD-10-CM | POA: Diagnosis not present

## 2018-01-11 DIAGNOSIS — M79674 Pain in right toe(s): Secondary | ICD-10-CM | POA: Diagnosis not present

## 2018-01-11 DIAGNOSIS — M79675 Pain in left toe(s): Secondary | ICD-10-CM | POA: Diagnosis not present

## 2018-01-30 DIAGNOSIS — G309 Alzheimer's disease, unspecified: Secondary | ICD-10-CM | POA: Diagnosis not present

## 2018-01-30 DIAGNOSIS — F0280 Dementia in other diseases classified elsewhere without behavioral disturbance: Secondary | ICD-10-CM | POA: Diagnosis not present

## 2018-01-30 DIAGNOSIS — F321 Major depressive disorder, single episode, moderate: Secondary | ICD-10-CM | POA: Diagnosis not present

## 2018-03-13 DIAGNOSIS — K219 Gastro-esophageal reflux disease without esophagitis: Secondary | ICD-10-CM | POA: Diagnosis not present

## 2018-03-13 DIAGNOSIS — I509 Heart failure, unspecified: Secondary | ICD-10-CM | POA: Diagnosis not present

## 2018-03-13 DIAGNOSIS — F0390 Unspecified dementia without behavioral disturbance: Secondary | ICD-10-CM | POA: Diagnosis not present

## 2018-03-13 DIAGNOSIS — N189 Chronic kidney disease, unspecified: Secondary | ICD-10-CM | POA: Diagnosis not present

## 2018-03-15 DIAGNOSIS — M79674 Pain in right toe(s): Secondary | ICD-10-CM | POA: Diagnosis not present

## 2018-03-15 DIAGNOSIS — B351 Tinea unguium: Secondary | ICD-10-CM | POA: Diagnosis not present

## 2018-03-15 DIAGNOSIS — M79675 Pain in left toe(s): Secondary | ICD-10-CM | POA: Diagnosis not present

## 2018-05-17 DIAGNOSIS — M79675 Pain in left toe(s): Secondary | ICD-10-CM | POA: Diagnosis not present

## 2018-05-17 DIAGNOSIS — B351 Tinea unguium: Secondary | ICD-10-CM | POA: Diagnosis not present

## 2018-05-17 DIAGNOSIS — M79674 Pain in right toe(s): Secondary | ICD-10-CM | POA: Diagnosis not present

## 2018-05-30 DIAGNOSIS — I2510 Atherosclerotic heart disease of native coronary artery without angina pectoris: Secondary | ICD-10-CM | POA: Diagnosis not present

## 2018-05-30 DIAGNOSIS — I509 Heart failure, unspecified: Secondary | ICD-10-CM | POA: Diagnosis not present

## 2018-05-30 DIAGNOSIS — M109 Gout, unspecified: Secondary | ICD-10-CM | POA: Diagnosis not present

## 2018-05-30 DIAGNOSIS — M6281 Muscle weakness (generalized): Secondary | ICD-10-CM | POA: Diagnosis not present

## 2018-07-19 DIAGNOSIS — M79674 Pain in right toe(s): Secondary | ICD-10-CM | POA: Diagnosis not present

## 2018-07-19 DIAGNOSIS — M79675 Pain in left toe(s): Secondary | ICD-10-CM | POA: Diagnosis not present

## 2018-07-19 DIAGNOSIS — B351 Tinea unguium: Secondary | ICD-10-CM | POA: Diagnosis not present

## 2019-05-29 DEATH — deceased
